# Patient Record
Sex: Female | Born: 1975 | Race: Black or African American | Hispanic: No | Smoking: Never smoker
Health system: Southern US, Community
[De-identification: ages and names within clinical notes are randomized; demographics above are authoritative.]

## PROBLEM LIST (undated history)

## (undated) DIAGNOSIS — I10 Essential (primary) hypertension: Secondary | ICD-10-CM

## (undated) DIAGNOSIS — G43909 Migraine, unspecified, not intractable, without status migrainosus: Secondary | ICD-10-CM

## (undated) DIAGNOSIS — I2699 Other pulmonary embolism without acute cor pulmonale: Secondary | ICD-10-CM

## (undated) DIAGNOSIS — D649 Anemia, unspecified: Secondary | ICD-10-CM

## (undated) DIAGNOSIS — Z8759 Personal history of other complications of pregnancy, childbirth and the puerperium: Secondary | ICD-10-CM

## (undated) DIAGNOSIS — I82409 Acute embolism and thrombosis of unspecified deep veins of unspecified lower extremity: Secondary | ICD-10-CM

## (undated) DIAGNOSIS — O2230 Deep phlebothrombosis in pregnancy, unspecified trimester: Secondary | ICD-10-CM

## (undated) DIAGNOSIS — Z86718 Personal history of other venous thrombosis and embolism: Secondary | ICD-10-CM

## (undated) DIAGNOSIS — O24419 Gestational diabetes mellitus in pregnancy, unspecified control: Secondary | ICD-10-CM

## (undated) DIAGNOSIS — O09529 Supervision of elderly multigravida, unspecified trimester: Secondary | ICD-10-CM

## (undated) HISTORY — DX: Migraine, unspecified, not intractable, without status migrainosus: G43.909

## (undated) HISTORY — DX: Personal history of other venous thrombosis and embolism: Z86.718

## (undated) HISTORY — DX: Gestational diabetes mellitus in pregnancy, unspecified control: O24.419

## (undated) HISTORY — DX: Personal history of other complications of pregnancy, childbirth and the puerperium: Z87.59

## (undated) HISTORY — DX: Essential (primary) hypertension: I10

## (undated) HISTORY — PX: KNEE SURGERY: SHX244

## (undated) HISTORY — DX: Anemia, unspecified: D64.9

---

## 2001-10-17 ENCOUNTER — Ambulatory Visit (HOSPITAL_COMMUNITY): Admission: RE | Admit: 2001-10-17 | Discharge: 2001-10-17 | Payer: Self-pay | Admitting: Pulmonary Disease

## 2001-12-02 ENCOUNTER — Ambulatory Visit (HOSPITAL_COMMUNITY): Admission: RE | Admit: 2001-12-02 | Discharge: 2001-12-02 | Payer: Self-pay | Admitting: Pulmonary Disease

## 2002-07-29 ENCOUNTER — Ambulatory Visit (HOSPITAL_COMMUNITY): Admission: RE | Admit: 2002-07-29 | Discharge: 2002-07-29 | Payer: Self-pay | Admitting: Pulmonary Disease

## 2002-08-12 ENCOUNTER — Ambulatory Visit (HOSPITAL_COMMUNITY): Admission: RE | Admit: 2002-08-12 | Discharge: 2002-08-12 | Payer: Self-pay | Admitting: Pulmonary Disease

## 2002-12-17 ENCOUNTER — Emergency Department (HOSPITAL_COMMUNITY): Admission: EM | Admit: 2002-12-17 | Discharge: 2002-12-17 | Payer: Self-pay | Admitting: Emergency Medicine

## 2004-12-29 ENCOUNTER — Other Ambulatory Visit: Admission: RE | Admit: 2004-12-29 | Discharge: 2004-12-29 | Payer: Self-pay | Admitting: Obstetrics and Gynecology

## 2005-01-31 ENCOUNTER — Ambulatory Visit (HOSPITAL_COMMUNITY): Admission: RE | Admit: 2005-01-31 | Discharge: 2005-01-31 | Payer: Self-pay | Admitting: Pulmonary Disease

## 2005-06-18 ENCOUNTER — Ambulatory Visit (HOSPITAL_COMMUNITY): Admission: RE | Admit: 2005-06-18 | Discharge: 2005-06-18 | Payer: Self-pay | Admitting: Pulmonary Disease

## 2005-10-16 ENCOUNTER — Ambulatory Visit (HOSPITAL_COMMUNITY): Admission: RE | Admit: 2005-10-16 | Discharge: 2005-10-16 | Payer: Self-pay | Admitting: Pulmonary Disease

## 2005-11-12 ENCOUNTER — Ambulatory Visit (HOSPITAL_COMMUNITY): Admission: RE | Admit: 2005-11-12 | Discharge: 2005-11-12 | Payer: Self-pay | Admitting: Pulmonary Disease

## 2005-11-22 ENCOUNTER — Ambulatory Visit (HOSPITAL_COMMUNITY): Admission: RE | Admit: 2005-11-22 | Discharge: 2005-11-22 | Payer: Self-pay | Admitting: Pulmonary Disease

## 2006-01-14 ENCOUNTER — Ambulatory Visit (HOSPITAL_COMMUNITY): Admission: RE | Admit: 2006-01-14 | Discharge: 2006-01-14 | Payer: Self-pay | Admitting: Pulmonary Disease

## 2006-01-14 ENCOUNTER — Ambulatory Visit: Payer: Self-pay | Admitting: *Deleted

## 2006-01-16 ENCOUNTER — Ambulatory Visit: Payer: Self-pay | Admitting: *Deleted

## 2006-01-18 ENCOUNTER — Ambulatory Visit (HOSPITAL_COMMUNITY): Admission: RE | Admit: 2006-01-18 | Discharge: 2006-01-18 | Payer: Self-pay | Admitting: *Deleted

## 2006-01-18 ENCOUNTER — Ambulatory Visit: Payer: Self-pay | Admitting: Cardiology

## 2006-02-01 ENCOUNTER — Ambulatory Visit: Payer: Self-pay | Admitting: *Deleted

## 2006-02-15 ENCOUNTER — Ambulatory Visit: Payer: Self-pay | Admitting: *Deleted

## 2006-03-19 ENCOUNTER — Ambulatory Visit: Payer: Self-pay | Admitting: *Deleted

## 2006-08-19 ENCOUNTER — Ambulatory Visit (HOSPITAL_COMMUNITY): Admission: RE | Admit: 2006-08-19 | Discharge: 2006-08-19 | Payer: Self-pay | Admitting: Pulmonary Disease

## 2007-02-26 ENCOUNTER — Ambulatory Visit (HOSPITAL_COMMUNITY): Admission: RE | Admit: 2007-02-26 | Discharge: 2007-02-26 | Payer: Self-pay | Admitting: Pulmonary Disease

## 2007-05-12 ENCOUNTER — Ambulatory Visit (HOSPITAL_COMMUNITY): Admission: RE | Admit: 2007-05-12 | Discharge: 2007-05-12 | Payer: Self-pay | Admitting: Pulmonary Disease

## 2007-05-26 ENCOUNTER — Ambulatory Visit (HOSPITAL_COMMUNITY): Admission: RE | Admit: 2007-05-26 | Discharge: 2007-05-26 | Payer: Self-pay | Admitting: Pulmonary Disease

## 2008-02-26 ENCOUNTER — Ambulatory Visit (HOSPITAL_COMMUNITY): Admission: RE | Admit: 2008-02-26 | Discharge: 2008-02-26 | Payer: Self-pay | Admitting: Pulmonary Disease

## 2009-01-24 ENCOUNTER — Ambulatory Visit (HOSPITAL_COMMUNITY): Admission: RE | Admit: 2009-01-24 | Discharge: 2009-01-24 | Payer: Self-pay | Admitting: Pulmonary Disease

## 2009-02-19 ENCOUNTER — Emergency Department (HOSPITAL_COMMUNITY): Admission: EM | Admit: 2009-02-19 | Discharge: 2009-02-19 | Payer: Self-pay | Admitting: Emergency Medicine

## 2009-10-18 ENCOUNTER — Ambulatory Visit (HOSPITAL_COMMUNITY): Admission: RE | Admit: 2009-10-18 | Discharge: 2009-10-18 | Payer: Self-pay | Admitting: Pulmonary Disease

## 2009-12-13 ENCOUNTER — Ambulatory Visit: Payer: Self-pay | Admitting: Gastroenterology

## 2009-12-13 DIAGNOSIS — R1013 Epigastric pain: Secondary | ICD-10-CM

## 2009-12-14 DIAGNOSIS — R143 Flatulence: Secondary | ICD-10-CM

## 2009-12-14 DIAGNOSIS — R1084 Generalized abdominal pain: Secondary | ICD-10-CM

## 2009-12-14 DIAGNOSIS — R142 Eructation: Secondary | ICD-10-CM

## 2009-12-14 DIAGNOSIS — R141 Gas pain: Secondary | ICD-10-CM

## 2009-12-16 LAB — CONVERTED CEMR LAB: Lipase: 47 units/L (ref 0–75)

## 2010-01-17 ENCOUNTER — Ambulatory Visit: Payer: Self-pay | Admitting: Gastroenterology

## 2010-03-02 IMAGING — CR DG CHEST 2V
2 series · 2 of 2 positions shown · non-contrast
Comparison: 01/24/2009

CLINICAL DATA: Left-sided chest pain.

CHEST - 2 VIEW

[view not recorded (1 of 2)]
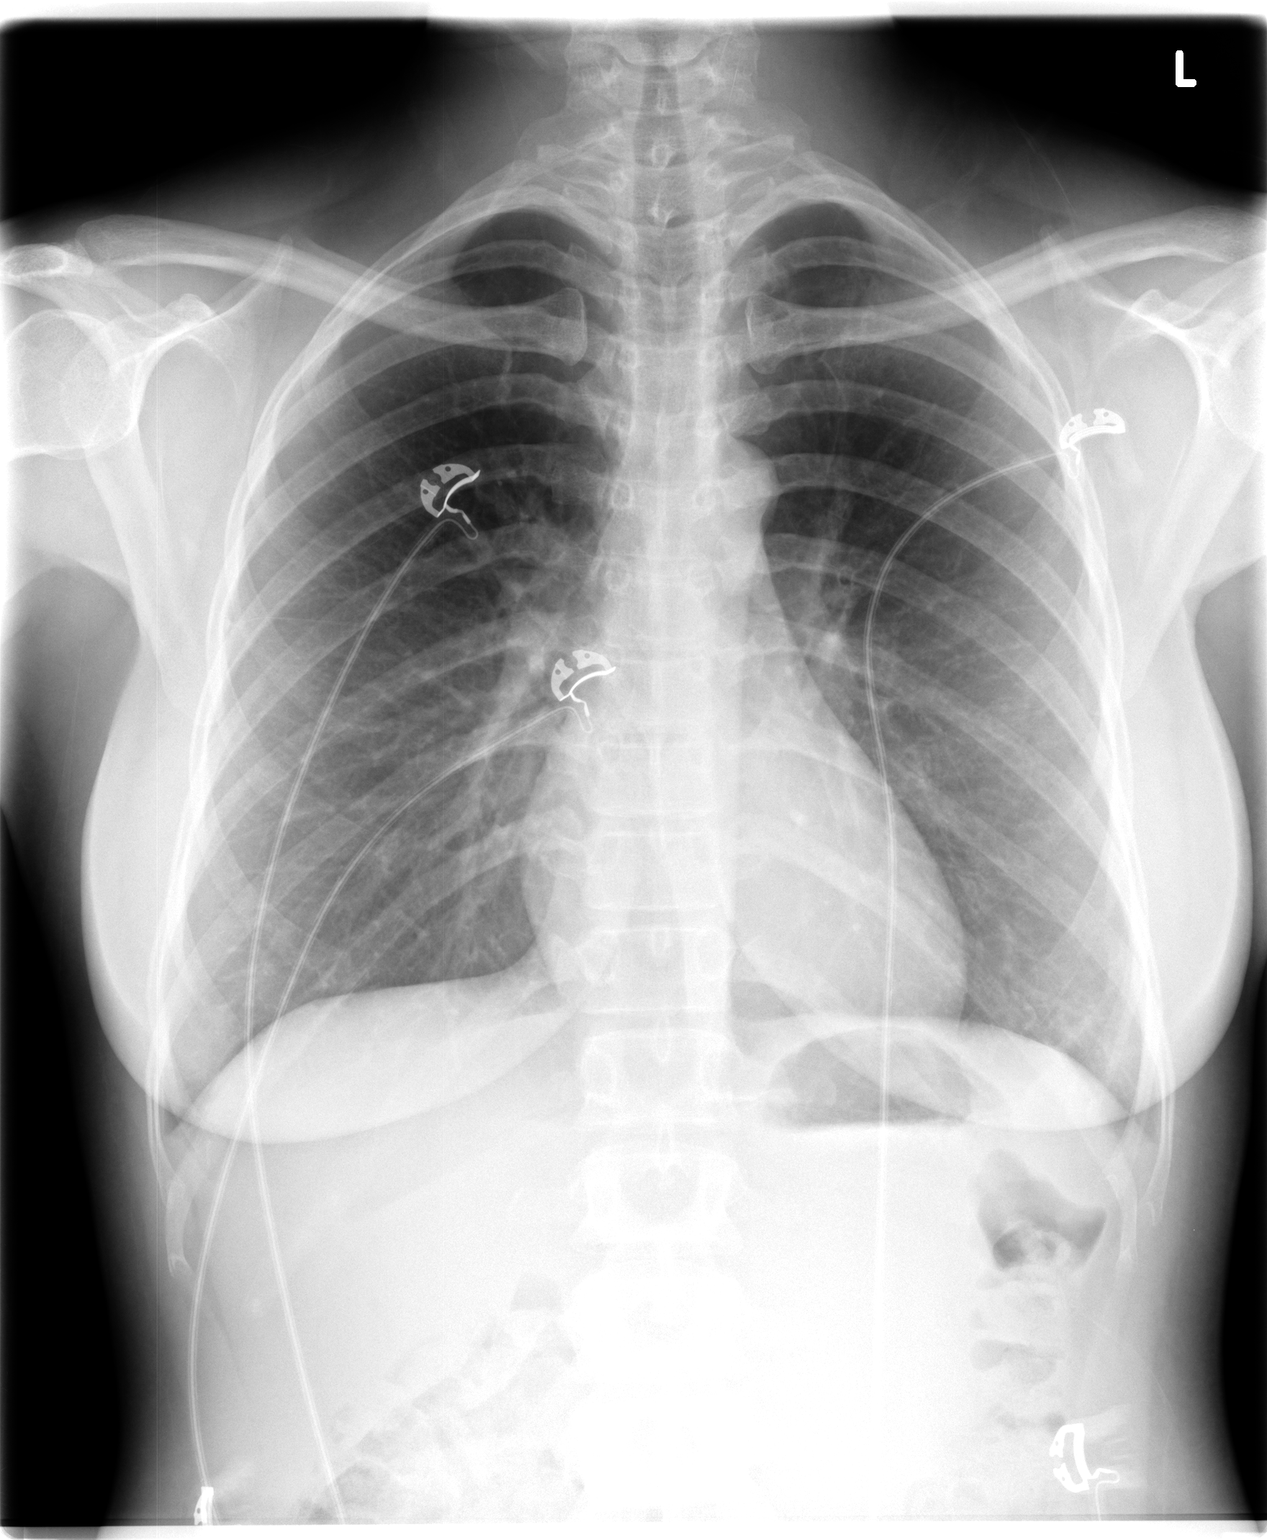

[view not recorded (2 of 2)]
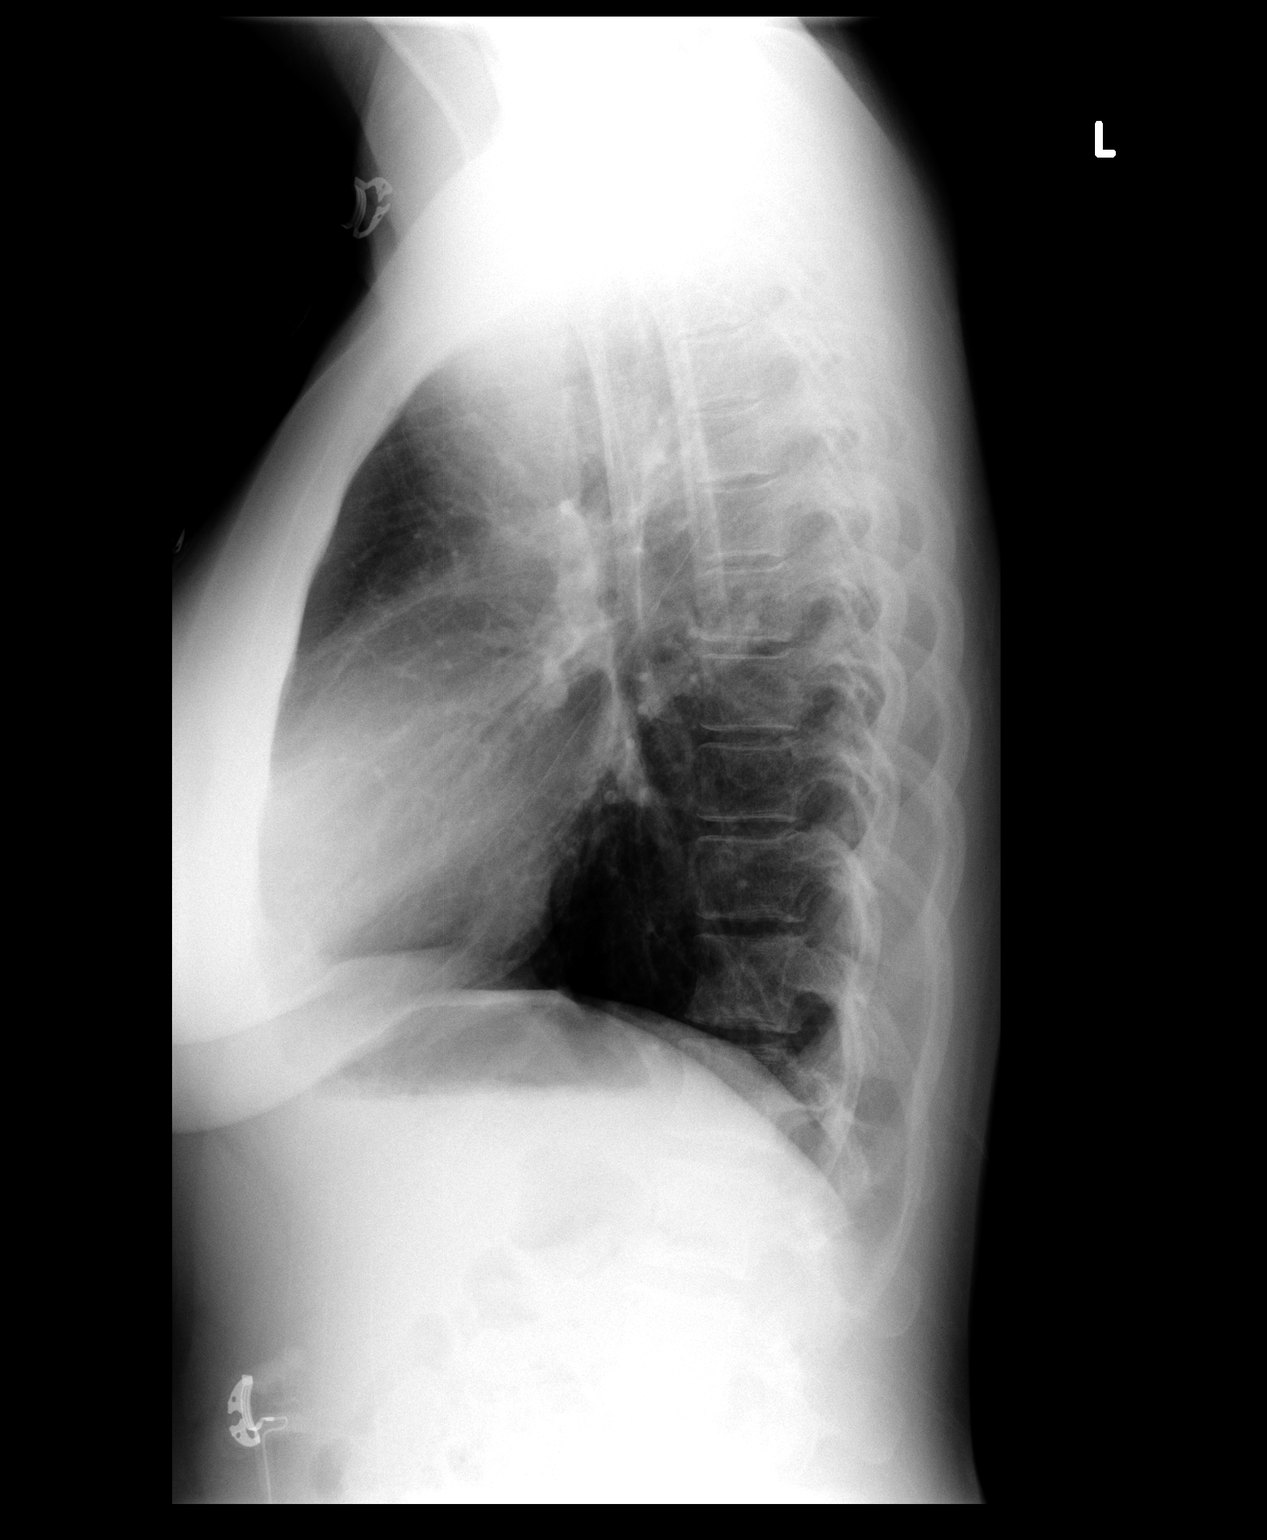

[2 of 2 positions shown; findings below may reference images not displayed]

FINDINGS: The heart size and vascularity are normal and the lungs
are clear.  No bony abnormality.
IMPRESSION: Normal chest, unchanged.

## 2010-04-03 ENCOUNTER — Ambulatory Visit (HOSPITAL_COMMUNITY): Admission: RE | Admit: 2010-04-03 | Discharge: 2010-04-03 | Payer: Self-pay | Admitting: Pulmonary Disease

## 2010-08-08 ENCOUNTER — Ambulatory Visit (HOSPITAL_COMMUNITY): Admission: RE | Admit: 2010-08-08 | Discharge: 2010-08-08 | Payer: Self-pay | Admitting: Pulmonary Disease

## 2010-08-10 ENCOUNTER — Encounter (HOSPITAL_COMMUNITY): Admission: RE | Admit: 2010-08-10 | Discharge: 2010-09-09 | Payer: Self-pay | Admitting: Pulmonary Disease

## 2010-08-21 ENCOUNTER — Ambulatory Visit (HOSPITAL_COMMUNITY): Admission: RE | Admit: 2010-08-21 | Discharge: 2010-08-21 | Payer: Self-pay | Admitting: Pulmonary Disease

## 2010-09-07 ENCOUNTER — Ambulatory Visit: Payer: Self-pay | Admitting: Gastroenterology

## 2010-09-20 ENCOUNTER — Ambulatory Visit: Payer: Self-pay | Admitting: Gastroenterology

## 2010-09-20 ENCOUNTER — Ambulatory Visit (HOSPITAL_COMMUNITY): Admission: RE | Admit: 2010-09-20 | Discharge: 2010-09-20 | Payer: Self-pay | Admitting: Gastroenterology

## 2010-12-26 NOTE — Assessment & Plan Note (Signed)
Summary: ABD PAIN   Visit Type:  Follow-up Visit Primary Care Provider:  Juanetta Gosling, M.D.  Chief Complaint:  F/U abd pain.  History of Present Illness: Sx started 2011 and Sx: worse-more frquent and more sever. Still bothered by intermittent abd pain. Had a spell SEP 10. Doubled over in pain and could hardly walk. Took Nexium and 2 Tylenol. After it went away, he did an UGI and HIDA which were negative. Rx: pill for abd pain?. pain: epigastric, no radiation, No NV or change in bowel habits. No black stool, dysphagia, pain with swallowing, or BRBPR. Weight loss: 2-3 lbs. Still having family stress. No EtOH, ASA, BCs, Goodys, Ibuprofen, Motrin or Aleve. BM: 3-4 times-mostly solid. abd pain 3-4x/wk, may last 30 mins to an hour, and happens multiple times a day. No triggers identified. Didn't feel avoiding diary or DA-LI made a difference.  Current Medications (verified): 1)  Tandem Plus 162-115.2-1 Mg Caps (Fefum-Fepo-Fa-B Cmp-C-Zn-Mn-Cu) .... Take 1 Tablet By Mouth Once A Day 2)  Nexium 40 Mg Cpdr (Esomeprazole Magnesium) .... Take 1 Tablet By Mouth Once A Day 3)  Lasix 40 Mg Tabs (Furosemide) .... Once Daily As Needed 4)  Tylenol .... As Needed 5)  Topamax 50 Mg Tabs (Topiramate) .... 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  Review of Systems       SEP 2011: HIDA w/ cck- GBEF 45%, UGI: NL  Vital Signs:  Patient profile:   35 year old female Height:      60 inches Weight:      123 pounds BMI:     24.11 Temp:     98.6 degrees F oral Pulse rate:   72 / minute BP sitting:   104 / 80  (left arm) Cuff size:   regular  Vitals Entered By: Cloria Spring LPN (September 07, 2010 9:04 AM)  Physical Exam  General:  Well developed, well nourished, no acute distress. Head:  Normocephalic and atraumatic. Eyes:  PERRL, no icterus. Mouth:  No deformity or lesions. Lungs:  Clear throughout to auscultation. Heart:  Regular rate and rhythm; no murmurs. Abdomen:  Soft,mild TTP in epigastrium and nondistended. No masses, hepatosplenomegaly or hernias noted. Normal bowel sounds. Negative Carnett's sign. Extremities:  No edema noted. Neurologic:  Alert and  oriented x4;  grossly normal neurologically.  Impression & Recommendations:  Problem # 1:  EPIGASTRIC PAIN (ICD-789.06) Assessment Unchanged most likely 2o to NON-ULCER DYSPEPSIA, LESS Likely 2o to H. pylori gastritis, eosinophilic gastitis, celiac sprue, PUD, or MALT lymphoma. EGD next week. Continue Nexium OPV in 2 mos.  CC: PCP  Orders: Est. Patient Level V (53664) Prescriptions: LIDOCAINE VISCOUS 2 % SOLN (LIDOCAINE HCL) 2 tsp mixed with 1 TBSP Maalox/Mylanta by mouth q4h as needed for abd pain  #100 cc x 5   Entered and Authorized by:   West Bali MD   Signed by:   West Bali MD on 09/07/2010   Method used:   Electronically to        [facility] [address] [phone]* (retail)       [address]       [place], [place]  [address]  Ph: [phone]       Fax: [phone]   RxID:   [id number]   Appended Document: ABD PAIN F/U OPV IS IN THE COMPUTER

## 2010-12-26 NOTE — Assessment & Plan Note (Signed)
Summary: SEVERE ABD PAIN/CM   Visit Type:  Initial Consult Referring Provider:  Juanetta Gosling Primary Care Provider:  Juanetta Gosling, M.D.  Chief Complaint:  Abd pain.  History of Present Illness: Had it in NOV and then it stopped a couple of days. Top of stomach, sharp, doesn't move. No NV, hb/indigestion, or problems swallowing. No precipitating factors. Worse: as the day goes on it gets worse, usu. by midday. This episode started 5 days-been about the same. Rx: Tylenol (eases some). No ASA, BC, Goodys, or Alve. Had one Advil for abd pain. No EtoH or tobacco. No weight loss. Diarrhea on Sun. Usu. BM q2-3x/week. Passing gas makes feel better. Bloating with most intense. Eating doesn't affect the pain. no IBS Dx ever. Ice cream Sun, yogurt this AM, no milk or cheese. Many psychosocial stressors: family, church.  Suppose to take Tandem daily. On Nexium since Nov and it helped but not helping now.  Preventive Screening-Counseling & Management      Drug Use:  no.    Current Medications (verified): 1)  Tandem Plus 162-115.2-1 Mg Caps (Fefum-Fepo-Fa-B Cmp-C-Zn-Mn-Cu) .... Take 1 Tablet By Mouth Once A Day 2)  Nexium 40 Mg Cpdr (Esomeprazole Magnesium) .... Take 1 Tablet By Mouth Once A Day 3)  Topamax 25 Mg Tabs (Topiramate) .... Three Tablets Twice Daily 4)  Lasix 40 Mg Tabs (Furosemide) .... Once Daily As Needed 5)  Tylenol .... 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 Drug Use:  no  Review of Systems       Records reviewed from 2003 to present.   ABD U/S in NOV 2010-WNLs NOV 2010:  UA (NO sX) POS NITRITES >100k E.cOLI HB 12.8 PLT 315 Cr 0.97 TBILI 0.4 ALK PHOS 38 AST 15 ALT 10 ALB  4.01 Dec 2009 Cr 1.05, AST 15, ALT 10 ALB 4.1 K 3.8 ALK PHOS 38 TBILI 0.3  Per HPI, otherwise all systems negative.  Vital Signs:  Patient profile:   35 year old female Height:      60 inches Weight:      126 pounds BMI:     24.70 Temp:     98.1 degrees F oral Pulse rate:   84 / minute BP sitting:   110 / 80  (left arm) Cuff size:   regular  Vitals Entered By: Cloria Spring LPN (December 13, 2009 10:22 AM)  Physical Exam  General:  Well developed, well nourished, no acute distress. Head:  Normocephalic and atraumatic. Eyes:  PERRLA, no icterus. Mouth:  No deformity or lesions. Neck:  Supple; no masses. Lungs:  Clear throughout to auscultation. Heart:  Regular rate and rhythm; no murmurs, rubs,  or bruits. Abdomen:  Soft, nondistended. No masses,  noted. Normal bowel sounds. Mild TTP in LUQ/RLQ w/o rebound or guarding. Nontender epigastrium. Msk:  Symmetrical with no gross deformities. Normal posture. Extremities:  No cyanosis, or edema noted. Neurologic:  Alert and  oriented x4;  grossly normal neurologically.  Impression & Recommendations:  Problem # 1:  EPIGASTRIC PAIN (ICD-789.06) Assessment New Intermittent likely 2o to non-ulcer dyspepsia. Differential includes IBS-mixed pattern, lactose intolerance, atypical reflux, H. pylori gastritis, less likely celiac sprue, autoimmune pancreatitis, SBBO, eosinophilic gastritis, MALT lymphoma, or malignancy. Pt hesitant  to have EGD. Will await H. pylori serology to decide on timing of EGD. Add probiotics and increase Nexium. OPV in 1 month. Samples #32 given DA-LI.  CC: Dr. Juanetta Gosling  Time to perform H&P, PE, review records, and discuss differential and treatment options: 45 mins  Other Orders: Consultation Level V (16109)  Patient Instructions: 1)  Take Digestive Advantage Lactose Intolerance daily. 2)  Increase Nexium to two times a day at 7 AM and 5 PM. 3)  Call me with results of H. pylori serology. If negative you will  need an EGD and if positive we will treat with Abx. 4)  Return visit in 1 month. 5)  Check Lipase today if blood still available. 6)  The medication list was reviewed and reconciled.  All changed / newly prescribed medications were explained.  A complete medication list was provided to the patient / caregiver.

## 2010-12-26 NOTE — Assessment & Plan Note (Signed)
Summary: EFU OV 29MO,ABD PAIN.BLOATING./GU   Visit Type:  Follow-up Visit Primary Care Provider:  Dr Rulon Sera  Chief Complaint:  follow up- doing ok.  History of Present Illness: Seen 12/13/2009 by Dr Darrick Penna for epigastric pain.  Started on PPI BID & DIgestive advantage.  Lipase normal.  A little better. Epigastric pain once per week, was constant.  Denies nausea or vomiting.  Taking digestive advantage as needed usu once weekly.  Nexium two times a day.  Iron daily.  H pylori negative.  On coumadin previously yrs ago became anemic been on iron since that time.  LMP 2wks ago, normal for her.  Hx heavy menses.  Appetite comes & goes.  BM 2-3 per wk without rectal bleeding or melena.  Satisfied w/ bowel movts.   Current Medications (verified): 1)  Tandem Plus 162-115.2-1 Mg Caps (Fefum-Fepo-Fa-B Cmp-C-Zn-Mn-Cu) .... Take 1 Tablet By Mouth Once A Day 2)  Nexium 40 Mg Cpdr (Esomeprazole Magnesium) .... Take 1 Tablet By Mouth Once A Day 3)  Topamax 25 Mg Tabs (Topiramate) .... Three Tablets Twice Daily 4)  Lasix 40 Mg Tabs (Furosemide) .... Once Daily As Needed 5)  Tylenol .... As Needed  Allergies (verified): No Known Drug Allergies  Review of Systems      See HPI General:  Denies fever, chills, sweats, anorexia, fatigue, weakness, malaise, weight loss, and sleep disorder. CV:  Denies chest pains, angina, palpitations, syncope, dyspnea on exertion, orthopnea, PND, peripheral edema, and claudication. Resp:  Denies dyspnea at rest, dyspnea with exercise, cough, sputum, wheezing, coughing up blood, and pleurisy. GI:  Denies difficulty swallowing, pain on swallowing, indigestion/heartburn, vomiting, vomiting blood, jaundice, diarrhea, bloody BM's, black BMs, and fecal incontinence. Derm:  Denies rash, itching, dry skin, hives, moles, warts, and unhealing ulcers. Psych:  Denies depression, anxiety, memory loss, suicidal ideation, hallucinations, paranoia, phobia, and confusion. Heme:  Denies  bruising, bleeding, and enlarged lymph nodes.  Vital Signs:  Patient profile:   35 year old female Height:      60 inches Weight:      127 pounds BMI:     24.89 Temp:     97.8 degrees F oral Pulse rate:   76 / minute BP sitting:   108 / 78  (left arm) Cuff size:   regular  Vitals Entered By: Hendricks Limes LPN (January 17, 2010 11:18 AM)  Physical Exam  General:  Well developed, well nourished, no acute distress. Head:  Normocephalic and atraumatic. Eyes:  Sclera clear, no icterus. Ears:  Normal auditory acuity. Nose:  No deformity, discharge,  or lesions. Mouth:  No deformity or lesions, dentition normal. Neck:  Supple; no masses or thyromegaly. Heart:  Regular rate and rhythm; no murmurs, rubs,  or bruits. Abdomen:  Soft, nontender and nondistended. No masses, hepatosplenomegaly or hernias noted. Normal bowel sounds.without guarding and without rebound.   Msk:  Symmetrical with no gross deformities. Normal posture. Pulses:  Normal pulses noted. Extremities:  No clubbing, cyanosis, edema or deformities noted. Neurologic:  Alert and  oriented x4;  grossly normal neurologically. Skin:  Intact without significant lesions or rashes. Cervical Nodes:  No significant cervical adenopathy. Axillary Nodes:  No significant axillary adenopathy. Inguinal Nodes:  No significant inguinal adenopathy. Psych:  Alert and cooperative. Normal mood and affect.  Impression & Recommendations:  Problem # 1:  EPIGASTRIC PAIN (ICD-789.06) 35 y/o black female w/ epigastric pain with good response to two times a day PPI, but not complete.  I encouraged EGD for further  evaluation to r/o PUD, gastritis; however, pt does not want to proceed at this time.  I have discussed risks and benefits which include, but are not limited to, bleeding, infection, perforation, or medication reaction.  She will call within next couple weeks of she changes her mind.   Orders: Est. Patient Level III (86578)  Patient  Instructions: 1)  Decrease nexium to 40mg  daily 2)  If you change your mind and want EGD, please let us know 3)  6 months w/ Dr Darrick Penna

## 2011-03-08 LAB — DIFFERENTIAL
Basophils Absolute: 0 10*3/uL (ref 0.0–0.1)
Eosinophils Relative: 3 % (ref 0–5)
Lymphocytes Relative: 34 % (ref 12–46)
Lymphs Abs: 1.9 10*3/uL (ref 0.7–4.0)
Monocytes Absolute: 0.4 10*3/uL (ref 0.1–1.0)
Monocytes Relative: 8 % (ref 3–12)
Neutro Abs: 3 10*3/uL (ref 1.7–7.7)

## 2011-03-08 LAB — CBC
HCT: 40.4 % (ref 36.0–46.0)
Hemoglobin: 13.4 g/dL (ref 12.0–15.0)
RBC: 4.74 MIL/uL (ref 3.87–5.11)
WBC: 5.5 10*3/uL (ref 4.0–10.5)

## 2011-03-08 LAB — APTT: aPTT: 34 seconds (ref 24–37)

## 2011-03-08 LAB — PROTIME-INR: INR: 1 (ref 0.00–1.49)

## 2011-04-13 NOTE — Procedures (Signed)
NAME:  Sydney Holloway, SIELER             ACCOUNT NO.:  [id number]   MEDICAL RECORD NO.:  [id number]          PATIENT TYPE:  OUT   LOCATION:  RAD                           FACILITY:  [facility]   PHYSICIAN:  Cacao Bing, M.D. [facility] OF BIRTH:  14-Jul-1976   DATE OF PROCEDURE:  01/18/2006  DATE OF DISCHARGE:                                  ECHOCARDIOGRAM   REFERRING PHYSICIAN:  Ramon Dredge L. Juanetta Gosling, M.D.   CLINICAL DATA:  A 35 year old woman with exertional chest discomfort and a  history of pulmonary embolism.   RESULTS:  1.  Treadmill exercise performed to a workload of 13 METS and heart rate of      181, 96% of age-predicted maximum.  Exercise discontinued due to      fatigue; transient chest tightness also reported.  2.  Blood pressure increased from a resting value of 115/75 to 170/70.      During exercise, a normal response.  No arrhythmias noted.  3.  Baseline EKG:  Normal sinus rhythm; within normal limits.  4.  Stress EKG with 1-2 mm of upsloping ST segment depression, primarily in      the inferior leads.  Rapid reversion towards normal in recovery.   BASELINE ECHOCARDIOGRAM:  1.  Normal chamber dimensions; normal right ventricular systolic function;      normal aortic and tricuspid valve; slight mitral valve thickening with      normal function; normal regional and global left ventricular systolic      function.  2.  Post stress echocardiogram:  Hyperdynamic function in all myocardial      segments.   IMPRESSION:  Very good exercise tolerance with neither electrocardiographic  nor echocardiographic evidence for myocardial ischemia or infarction. Other  findings as noted.      Lerna Bing, M.D. [facility]  Electronically Signed     RR/MEDQ  D:  01/18/2006  T:  01/19/2006  Job:  [id number]

## 2011-04-13 NOTE — Procedures (Signed)
NAME:  Sydney Holloway, Sydney Holloway NO.:  [id number]   MEDICAL RECORD NO.:  [id number]          PATIENT TYPE:  OUT   LOCATION:  RAD                           FACILITY:  [facility]   PHYSICIAN:  Vida Roller, M.D.   DATE OF BIRTH:  October 26, 1976   DATE OF PROCEDURE:  01/14/2006  DATE OF DISCHARGE:                                  ECHOCARDIOGRAM   INDICATIONS:  This is a 35 year old female with a history of pulmonary  embolism and shortness of breath.   TAPE NUMBER:  [id number].  Tape count [id number].   REFERRING PHYSICIAN:  Dr. Kari Baars.   The technical quality of the procedure is adequate.   M-MODE TRACINGS:  The aorta is 28 mm.   Left atrium is 31 mm.   Septum is 10 mm.   Posterior wall is 10 mm.   Left ventricular diastolic dimension is 41 mm.   Left ventricular systolic dimension is 30 mm.   Two-dimensional echocardiogram and Doppler imaging:  The left ventricle is  normal size.  There is normal systolic function with estimated ejection  fraction 55-60%.  There are no wall motion abnormalities seen.   The right ventricle is normal size with normal systolic function.   Both atria are normal size.   The aortic valve is delicate.  There is no stenosis or regurgitation.   The mitral valve is delicate.  There is no significant stenosis.  There is  trivial regurgitation.   Pulmonic valve has trivial regurgitation.   The tricuspid valve has trivial regurgitation.   There is no pericardial effusion.   The inferior vena cava appears to be normal size.   The ascending aorta is normal to the limits of the study.   ASSESSMENT:  Normal echocardiogram.      Vida Roller, M.D.  Electronically Signed     JH/MEDQ  D:  01/14/2006  T:  01/15/2006  Job:  [id number]

## 2011-06-25 ENCOUNTER — Encounter (HOSPITAL_COMMUNITY): Payer: Self-pay

## 2011-06-25 ENCOUNTER — Ambulatory Visit (HOSPITAL_COMMUNITY)
Admission: RE | Admit: 2011-06-25 | Discharge: 2011-06-25 | Disposition: A | Discharge status: Home / Self Care | Payer: 59 | Source: Ambulatory Visit | Attending: Pulmonary Disease | Admitting: Pulmonary Disease

## 2011-06-25 ENCOUNTER — Other Ambulatory Visit (HOSPITAL_COMMUNITY): Payer: Self-pay | Admitting: Pulmonary Disease

## 2011-06-25 DIAGNOSIS — R7989 Other specified abnormal findings of blood chemistry: Secondary | ICD-10-CM | POA: Insufficient documentation

## 2011-06-25 DIAGNOSIS — R0602 Shortness of breath: Secondary | ICD-10-CM | POA: Insufficient documentation

## 2011-06-25 MED ORDER — IOHEXOL 350 MG/ML SOLN
100.0000 mL | Freq: Once | INTRAVENOUS | Status: AC | PRN
  Administered 2011-06-25: 100 mL via INTRAVENOUS

## 2011-11-09 ENCOUNTER — Ambulatory Visit (HOSPITAL_COMMUNITY)
Admission: RE | Admit: 2011-11-09 | Discharge: 2011-11-09 | Disposition: A | Discharge status: Home / Self Care | Payer: 59 | Source: Ambulatory Visit | Attending: Pulmonary Disease | Admitting: Pulmonary Disease

## 2011-11-09 ENCOUNTER — Other Ambulatory Visit (HOSPITAL_COMMUNITY): Payer: Self-pay | Admitting: Pulmonary Disease

## 2011-11-09 DIAGNOSIS — M542 Cervicalgia: Secondary | ICD-10-CM | POA: Insufficient documentation

## 2011-11-09 DIAGNOSIS — M25519 Pain in unspecified shoulder: Secondary | ICD-10-CM

## 2011-11-09 DIAGNOSIS — R209 Unspecified disturbances of skin sensation: Secondary | ICD-10-CM | POA: Insufficient documentation

## 2012-01-02 ENCOUNTER — Other Ambulatory Visit (HOSPITAL_COMMUNITY): Payer: Self-pay | Admitting: Pulmonary Disease

## 2012-01-02 DIAGNOSIS — R52 Pain, unspecified: Secondary | ICD-10-CM

## 2012-01-03 ENCOUNTER — Ambulatory Visit (HOSPITAL_COMMUNITY)
Admission: RE | Admit: 2012-01-03 | Discharge: 2012-01-03 | Disposition: A | Discharge status: Home / Self Care | Payer: 59 | Source: Ambulatory Visit | Attending: Pulmonary Disease | Admitting: Pulmonary Disease

## 2012-01-03 DIAGNOSIS — M67919 Unspecified disorder of synovium and tendon, unspecified shoulder: Secondary | ICD-10-CM | POA: Insufficient documentation

## 2012-01-03 DIAGNOSIS — R52 Pain, unspecified: Secondary | ICD-10-CM

## 2012-01-03 DIAGNOSIS — M719 Bursopathy, unspecified: Secondary | ICD-10-CM | POA: Insufficient documentation

## 2012-01-03 DIAGNOSIS — M25519 Pain in unspecified shoulder: Secondary | ICD-10-CM | POA: Insufficient documentation

## 2012-06-04 ENCOUNTER — Other Ambulatory Visit (HOSPITAL_COMMUNITY): Payer: Self-pay | Admitting: Pulmonary Disease

## 2012-06-04 ENCOUNTER — Ambulatory Visit (HOSPITAL_COMMUNITY)
Admission: RE | Admit: 2012-06-04 | Discharge: 2012-06-04 | Disposition: A | Discharge status: Home / Self Care | Payer: 59 | Source: Ambulatory Visit | Attending: Pulmonary Disease | Admitting: Pulmonary Disease

## 2012-06-04 DIAGNOSIS — M25579 Pain in unspecified ankle and joints of unspecified foot: Secondary | ICD-10-CM | POA: Insufficient documentation

## 2012-06-04 DIAGNOSIS — M79672 Pain in left foot: Secondary | ICD-10-CM

## 2012-06-10 ENCOUNTER — Ambulatory Visit (HOSPITAL_COMMUNITY)
Admission: RE | Admit: 2012-06-10 | Discharge: 2012-06-10 | Disposition: A | Discharge status: Home / Self Care | Payer: 59 | Source: Ambulatory Visit | Attending: Pulmonary Disease | Admitting: Pulmonary Disease

## 2012-06-10 ENCOUNTER — Other Ambulatory Visit (HOSPITAL_COMMUNITY): Payer: Self-pay | Admitting: Pulmonary Disease

## 2012-06-10 DIAGNOSIS — M79609 Pain in unspecified limb: Secondary | ICD-10-CM | POA: Insufficient documentation

## 2012-06-10 DIAGNOSIS — R52 Pain, unspecified: Secondary | ICD-10-CM | POA: Insufficient documentation

## 2012-06-10 DIAGNOSIS — R609 Edema, unspecified: Secondary | ICD-10-CM

## 2012-06-13 ENCOUNTER — Other Ambulatory Visit: Payer: Self-pay | Admitting: Obstetrics and Gynecology

## 2012-07-14 ENCOUNTER — Other Ambulatory Visit (HOSPITAL_COMMUNITY): Payer: Self-pay | Admitting: Pulmonary Disease

## 2012-07-14 ENCOUNTER — Ambulatory Visit (HOSPITAL_COMMUNITY)
Admission: RE | Admit: 2012-07-14 | Discharge: 2012-07-14 | Disposition: A | Discharge status: Home / Self Care | Payer: 59 | Source: Ambulatory Visit | Attending: Pulmonary Disease | Admitting: Pulmonary Disease

## 2012-07-14 DIAGNOSIS — S6990XA Unspecified injury of unspecified wrist, hand and finger(s), initial encounter: Secondary | ICD-10-CM | POA: Insufficient documentation

## 2012-07-14 DIAGNOSIS — W19XXXA Unspecified fall, initial encounter: Secondary | ICD-10-CM | POA: Insufficient documentation

## 2013-07-23 ENCOUNTER — Other Ambulatory Visit (HOSPITAL_COMMUNITY): Payer: Self-pay | Admitting: Pulmonary Disease

## 2013-07-23 ENCOUNTER — Ambulatory Visit (HOSPITAL_COMMUNITY)
Admission: RE | Admit: 2013-07-23 | Discharge: 2013-07-23 | Disposition: A | Discharge status: Home / Self Care | Payer: 59 | Source: Ambulatory Visit | Attending: Pulmonary Disease | Admitting: Pulmonary Disease

## 2013-07-23 DIAGNOSIS — R109 Unspecified abdominal pain: Secondary | ICD-10-CM

## 2013-10-05 ENCOUNTER — Other Ambulatory Visit (HOSPITAL_COMMUNITY): Payer: Self-pay | Admitting: Pulmonary Disease

## 2013-10-05 ENCOUNTER — Ambulatory Visit (HOSPITAL_COMMUNITY)
Admission: RE | Admit: 2013-10-05 | Discharge: 2013-10-05 | Disposition: A | Discharge status: Home / Self Care | Payer: 59 | Source: Ambulatory Visit | Attending: Pulmonary Disease | Admitting: Pulmonary Disease

## 2013-10-05 DIAGNOSIS — M25569 Pain in unspecified knee: Secondary | ICD-10-CM | POA: Insufficient documentation

## 2013-10-05 DIAGNOSIS — R52 Pain, unspecified: Secondary | ICD-10-CM

## 2013-10-27 ENCOUNTER — Other Ambulatory Visit (HOSPITAL_COMMUNITY): Payer: Self-pay | Admitting: Pulmonary Disease

## 2013-10-27 DIAGNOSIS — G8929 Other chronic pain: Secondary | ICD-10-CM

## 2013-10-28 ENCOUNTER — Ambulatory Visit (HOSPITAL_COMMUNITY)
Admission: RE | Admit: 2013-10-28 | Discharge: 2013-10-28 | Disposition: A | Discharge status: Home / Self Care | Payer: 59 | Source: Ambulatory Visit | Attending: Pulmonary Disease | Admitting: Pulmonary Disease

## 2013-10-28 DIAGNOSIS — M25469 Effusion, unspecified knee: Secondary | ICD-10-CM | POA: Insufficient documentation

## 2013-10-28 DIAGNOSIS — M25569 Pain in unspecified knee: Secondary | ICD-10-CM | POA: Insufficient documentation

## 2013-10-28 DIAGNOSIS — M23305 Other meniscus derangements, unspecified medial meniscus, unspecified knee: Secondary | ICD-10-CM | POA: Insufficient documentation

## 2013-10-28 DIAGNOSIS — G8929 Other chronic pain: Secondary | ICD-10-CM

## 2013-11-02 ENCOUNTER — Other Ambulatory Visit (HOSPITAL_COMMUNITY): Payer: Self-pay | Admitting: Pulmonary Disease

## 2013-11-02 ENCOUNTER — Ambulatory Visit (HOSPITAL_COMMUNITY)
Admission: RE | Admit: 2013-11-02 | Discharge: 2013-11-02 | Disposition: A | Discharge status: Home / Self Care | Payer: 59 | Source: Ambulatory Visit | Attending: Pulmonary Disease | Admitting: Pulmonary Disease

## 2013-11-02 DIAGNOSIS — R091 Pleurisy: Secondary | ICD-10-CM | POA: Insufficient documentation

## 2013-11-02 DIAGNOSIS — M549 Dorsalgia, unspecified: Secondary | ICD-10-CM | POA: Insufficient documentation

## 2013-12-24 ENCOUNTER — Ambulatory Visit (HOSPITAL_COMMUNITY)
Admission: RE | Admit: 2013-12-24 | Discharge: 2013-12-24 | Disposition: A | Discharge status: Home / Self Care | Payer: 59 | Source: Ambulatory Visit | Attending: Orthopedic Surgery | Admitting: Orthopedic Surgery

## 2013-12-24 DIAGNOSIS — M6281 Muscle weakness (generalized): Secondary | ICD-10-CM | POA: Insufficient documentation

## 2013-12-24 DIAGNOSIS — R29898 Other symptoms and signs involving the musculoskeletal system: Secondary | ICD-10-CM

## 2013-12-24 DIAGNOSIS — M25659 Stiffness of unspecified hip, not elsewhere classified: Secondary | ICD-10-CM | POA: Insufficient documentation

## 2013-12-24 DIAGNOSIS — M25569 Pain in unspecified knee: Secondary | ICD-10-CM | POA: Insufficient documentation

## 2013-12-24 DIAGNOSIS — M25669 Stiffness of unspecified knee, not elsewhere classified: Secondary | ICD-10-CM

## 2013-12-24 DIAGNOSIS — R262 Difficulty in walking, not elsewhere classified: Secondary | ICD-10-CM

## 2013-12-24 DIAGNOSIS — IMO0001 Reserved for inherently not codable concepts without codable children: Secondary | ICD-10-CM | POA: Insufficient documentation

## 2013-12-24 NOTE — Evaluation (Signed)
Physical Therapy Evaluation  Patient Details  Name: Sydney Holloway MRN: [id number] Date of Birth: 26-May-1976  Today's Date: 12/24/2013 Time: [id number] PT Time Calculation (min): 45 min Charge evaluation 1350-1420; there ex 1421-1435             Visit#: 1 of 12  Re-eval: 01/23/14 Assessment Diagnosis: s/p Rt arthroscopic surgery Surgical Date: 12/03/13 Next MD Visit: 01/13/2014 Prior Therapy: none  Authorization: [employer]      Past Medical History: No past medical history on file. Past Surgical History: No past surgical history on file.  Subjective Symptoms/Limitations Symptoms: Pt states that her Rt knee just started bothering her she is very active she had arthroscopic surgery on 12/03/2013 there was no meniscal tear.  Pt comes to the department walking with one crutch.  Pt states that she is still having quite a bit of swelling and that her knee "catches" when she is walking down an incline, getting into a car or going up steps.  The pt states that since the surgery she is about 30% better.  Pt states she has not been icing How long can you sit comfortably?: Pt has not sat with her knee bent she keeps it straight. How long can you stand comfortably?: Pt tends to shift her weight; she can stand 10 minutes. How long can you walk comfortably?: Pt is walking with one crutch able to walk for 5 minutes.  Special Tests: waking up three times a night Pain Assessment Currently in Pain?: Yes Pain Score: 4  (worst pain has been a 6/10 best has been a 3/10) Pain Location: Knee Pain Orientation: Right Pain Type: Chronic pain Pain Onset: 1 to 4 weeks ago Pain Frequency: Constant Pain Relieving Factors: medication Effect of Pain on Daily Activities: increases    Balance Screening Balance Screen Has the patient fallen in the past 6 months: No  Prior Functioning  Prior Function Vocation: Full time employment Vocation Requirements: walking, sitting,  Leisure: Hobbies-yes  (Comment) Comments: walk, work out, Retail bankerchoir director,      Sensation/Coordination/Flexibility/Functional Tests Functional Tests Functional Tests: foto 34  Assessment RLE AROM (degrees) Right Knee Extension: 15 Right Knee Flexion: 86 RLE Strength Right Hip Flexion: 3/5 Right Hip Extension: 4/5 Right Hip ABduction: 3+/5 Right Hip ADduction: 5/5 Right Knee Flexion: 4/5 Right Knee Extension: 3/5 Right Ankle Dorsiflexion: 3+/5  Exercise/Treatments    Stretches Active Hamstring Stretch: 3 reps;30 seconds   Seated Long Arc Quad: 10 reps Other Seated Knee Exercises: heelraise/toe raise x 10 Supine heelslides x 10 Quad Sets: 10 reps Heel Slides: 5 reps Straight Leg Raises: 5 reps Sidelying Hip ABduction: 5 reps Prone  Hamstring Curl: 5 reps Hip Extension: 5 reps    Physical Therapy Assessment and Plan PT Assessment and Plan Clinical Impression Statement: Pt is a 38 yo female who underwent a Rt arthroscopic surgery with debridement.  There were no tears.  Pt now has decreased ROM, decreased strength, and difficulty walking.  Pt will benefit from PT to address these issues and retrun pt to prior functioning status.  Pt will benefit from skilled therapeutic intervention in order to improve on the following deficits: Decreased activity tolerance;Decreased balance;Pain;Difficulty walking;Decreased strength;Decreased range of motion Rehab Potential: Good PT Frequency: Min 3X/week PT Duration: 4 weeks PT Treatment/Interventions: Therapeutic activities;Therapeutic exercise;Manual techniques;Patient/family education;Modalities;Gait training;Stair training PT Plan: Pt to be seen for weightbearing activities to strengthen pt functionally as well as ROM; wean from crutch ASAP    Goals Home Exercise Program Pt/caregiver will Perform Home  Exercise Program: For increased ROM;For increased strengthening PT Goal: Perform Home Exercise Program - Progress: Goal set today PT Short Term  Goals Time to Complete Short Term Goals: 2 weeks PT Short Term Goal 1: Pt to be able to sit for 30 minutes with knees bent with comfort to be able to go out to eat PT Short Term Goal 2: Pt to be albe to ambulate inside without a crutch PT Short Term Goal 3: Pt to be able to stand for 20 minutes to perform part of choir  activity. PT Short Term Goal 4: Pt to be able to walk for 20 minutes for short shopping trips  PT Short Term Goal 5: Pt waking one time a night. PT Long Term Goals Time to Complete Long Term Goals: 4 weeks PT Long Term Goal 1: I in advance Home exercise program PT Long Term Goal 2: Pt to be able to sit for 60 minutes at a time to perform work duties Long Term Goal 3: Pt to be able to walk in and outside without crutch Long Term Goal 4: Pt to be able to stand for an hour at a time to perform choir duties PT Long Term Goal 5: Pt to be able to walk for 60 mintues for good health habits Additional PT Long Term Goals?: Yes PT Long Term Goal 6: Pt sleep all night without wakingl  Problem List Patient Active Problem List   Diagnosis Date Noted  . Stiffness of joint, not elsewhere classified, lower leg 12/24/2013  . Difficulty in walking(719.7) 12/24/2013  . Decreased strength involving knee joint 12/24/2013  . Pain in joint, lower leg 12/24/2013  . ABDOMINAL BLOATING 12/14/2009  . ABDOMINAL PAIN, GENERALIZED 12/14/2009  . EPIGASTRIC PAIN 12/13/2009    PT Plan of Care PT Home Exercise Plan: given   GP    RUSSELL,CINDY 12/24/2013, 4:38 PM  Physician Documentation Your signature is required to indicate approval of the treatment plan as stated above.  Please sign and either send electronically or make a copy of this report for your files and return this physician signed original.   Please mark one 1.__approve of plan  2. ___approve of plan with the following conditions.   ______________________________                                                           _____________________ Physician Signature                                                                                                             Date

## 2013-12-30 ENCOUNTER — Ambulatory Visit (HOSPITAL_COMMUNITY)
Admission: RE | Admit: 2013-12-30 | Discharge: 2013-12-30 | Disposition: A | Discharge status: Home / Self Care | Payer: 59 | Source: Ambulatory Visit | Attending: Pulmonary Disease | Admitting: Pulmonary Disease

## 2013-12-30 DIAGNOSIS — M25569 Pain in unspecified knee: Secondary | ICD-10-CM

## 2013-12-30 DIAGNOSIS — IMO0001 Reserved for inherently not codable concepts without codable children: Secondary | ICD-10-CM | POA: Insufficient documentation

## 2013-12-30 DIAGNOSIS — M25659 Stiffness of unspecified hip, not elsewhere classified: Secondary | ICD-10-CM | POA: Insufficient documentation

## 2013-12-30 DIAGNOSIS — R262 Difficulty in walking, not elsewhere classified: Secondary | ICD-10-CM

## 2013-12-30 DIAGNOSIS — M6281 Muscle weakness (generalized): Secondary | ICD-10-CM | POA: Insufficient documentation

## 2013-12-30 DIAGNOSIS — M25669 Stiffness of unspecified knee, not elsewhere classified: Secondary | ICD-10-CM

## 2013-12-30 DIAGNOSIS — R29898 Other symptoms and signs involving the musculoskeletal system: Secondary | ICD-10-CM

## 2013-12-30 NOTE — Progress Notes (Signed)
Physical Therapy Treatment Patient Details  Name: Sydney Holloway MRN: [id number] Date of Birth: May 16, 1976  Today's Date: 12/30/2013 Time: 1518-1600 PT Time Calculation (min): 42 min Charge:  There ex [id number]; manual 1549-1600 Visit#: 2 of 12  Re-eval: 01/23/14    Authorization: [employer]   Subjective: Symptoms/Limitations Symptoms: Pt states she quit using the crutch simply because it was bothering her.   Pain Assessment Pain Score: 4  Pain Location: Knee Pain Orientation: Right Pain Type: Chronic pain    Exercise/Treatments Gastroc Stretch: Limitations Gastroc Stretch Limitations: slant board x 30" Aerobic Stationary Bike: 7' for improved ROM unable to make rotation. Standing Heel Raises: 10 reps Knee Flexion: 10 reps Terminal Knee Extension: 10 reps;Theraband Theraband Level (Terminal Knee Extension): Level 4 (Blue) Lateral Step Up: Right;10 reps Forward Step Up: 10 reps;Step Height: 4" Rocker Board: 2 minutes   Supine Heel Slides: 10 reps Terminal Knee Extension: 10 reps Knee Extension: PROM Knee Flexion: PROM   Manual Therapy Manual Therapy: Other (comment) Other Manual Therapy: Manual techniques used to work on decreasing swelling   Physical Therapy Assessment and Plan PT Assessment and Plan Clinical Impression Statement: Pt to department with abnormal gt as pt was keeping her knee in a flexed postion.  Pt gt trained and able to obtain a normal heel toe gt with equal stride.  Instructed pt in new weightbearing exercises with therapist facilitation for proper technique.  Pt has noted suprapatella swelling manual technique used to decrease swelling. PT Plan: Continue with strengthening and swelling reducing     Goals  progressing  Problem List Patient Active Problem List   Diagnosis Date Noted  . Stiffness of joint, not elsewhere classified, lower leg 12/24/2013  . Difficulty in walking(719.7) 12/24/2013  . Decreased strength involving knee joint  12/24/2013  . Pain in joint, lower leg 12/24/2013  . ABDOMINAL BLOATING 12/14/2009  . ABDOMINAL PAIN, GENERALIZED 12/14/2009  . EPIGASTRIC PAIN 12/13/2009       GP    Jose Corvin,CINDY 12/30/2013, 4:15 PM

## 2014-01-01 ENCOUNTER — Ambulatory Visit (HOSPITAL_COMMUNITY)
Admission: RE | Admit: 2014-01-01 | Discharge: 2014-01-01 | Disposition: A | Discharge status: Home / Self Care | Payer: 59 | Source: Ambulatory Visit | Attending: Pulmonary Disease | Admitting: Pulmonary Disease

## 2014-01-01 DIAGNOSIS — R262 Difficulty in walking, not elsewhere classified: Secondary | ICD-10-CM

## 2014-01-01 DIAGNOSIS — M25569 Pain in unspecified knee: Secondary | ICD-10-CM

## 2014-01-01 DIAGNOSIS — M25669 Stiffness of unspecified knee, not elsewhere classified: Secondary | ICD-10-CM

## 2014-01-01 DIAGNOSIS — R29898 Other symptoms and signs involving the musculoskeletal system: Secondary | ICD-10-CM

## 2014-01-01 NOTE — Progress Notes (Signed)
Physical Therapy Treatment Patient Details  Name: Sydney Holloway MRN: [id number] Date of Birth: 05/13/1976  Today's Date: 01/01/2014 Time: 8295-62131515-1555 PT Time Calculation (min): 40 min Charge;  There ex [id number]; manual [id number] Visit#: 4 of 12  Re-eval: 01/23/14   Authorization: [employer]  Subjective: Symptoms/Limitations Symptoms: Pt to department with normalized gait although slower cadance.  States that she was unable to sleep and therefore put a neck support under her knee. Pain Assessment Currently in Pain?: Yes Pain Score: 3  Pain Location: Knee Pain Orientation: Right Pain Type: Chronic pain    Exercise/Treatments   Aerobic Stationary Bike: 8:00 full rotation,.   Standing Heel Raises: 10 reps Knee Flexion: 10 reps Terminal Knee Extension: 10 reps Forward Step Up: 10 reps Rocker Board: 2 minutes Other Standing Knee Exercises: squat to pick up ball off 4" step raise up onto toes x 5    Supine Heel Slides: 10 reps Terminal Knee Extension: 10 reps Knee Extension: PROM Knee Flexion: PROM   Manual Therapy Other Manual Therapy: manual techniques used to decrease swelling and pain.  Physical Therapy Assessment and Plan PT Assessment and Plan Clinical Impression Statement: Pt to department wtin much better gait.  Therapist counseled pt not to sleep with anything under her knee.  Pt completed standing activities without UE assise with therapist facilitation.; PT Plan: Continue with strengthening and swelling reducing     Goals  progressing  Problem List Patient Active Problem List   Diagnosis Date Noted  . Stiffness of joint, not elsewhere classified, lower leg 12/24/2013  . Difficulty in walking(719.7) 12/24/2013  . Decreased strength involving knee joint 12/24/2013  . Pain in joint, lower leg 12/24/2013  . ABDOMINAL BLOATING 12/14/2009  . ABDOMINAL PAIN, GENERALIZED 12/14/2009  . EPIGASTRIC PAIN 12/13/2009       GP    RUSSELL,CINDY 01/01/2014, 4:30  PM

## 2014-01-04 ENCOUNTER — Ambulatory Visit (HOSPITAL_COMMUNITY)
Admission: RE | Admit: 2014-01-04 | Discharge: 2014-01-04 | Disposition: A | Discharge status: Home / Self Care | Payer: 59 | Source: Ambulatory Visit | Attending: Orthopedic Surgery | Admitting: Orthopedic Surgery

## 2014-01-04 ENCOUNTER — Inpatient Hospital Stay (HOSPITAL_COMMUNITY)
Admission: RE | Admit: 2014-01-04 | Discharge: 2014-01-04 | Disposition: A | Discharge status: Home / Self Care | Payer: 59 | Source: Ambulatory Visit | Attending: Physical Therapy | Admitting: Physical Therapy

## 2014-01-04 DIAGNOSIS — R29898 Other symptoms and signs involving the musculoskeletal system: Secondary | ICD-10-CM

## 2014-01-04 DIAGNOSIS — R262 Difficulty in walking, not elsewhere classified: Secondary | ICD-10-CM

## 2014-01-04 DIAGNOSIS — M25669 Stiffness of unspecified knee, not elsewhere classified: Secondary | ICD-10-CM

## 2014-01-04 DIAGNOSIS — M25569 Pain in unspecified knee: Secondary | ICD-10-CM

## 2014-01-04 NOTE — Progress Notes (Signed)
Physical Therapy Treatment Patient Details  Name: Sydney Holloway MRN: [id number] Date of Birth: 30-Oct-1976  Today's Date: 01/04/2014 Time: 4098-11911345-1425 PT Time Calculation (min): 40 min Charge 1345-1415; manual [id number] Visit#: 5 of 12  Re-eval: 01/23/14   Authorization: [employer]   Subjective: Symptoms/Limitations Symptoms: Pt states her knee is still swollen and still wants to catch. Pain Assessment Pain Score: 5  Pain Location: Knee Pain Orientation: Right  Exercise/Treatments  Stretches Active Hamstring Stretch: 3 reps;30 seconds Gastroc Stretch Limitations: slant board 30" x 2 Aerobic Stationary Bike: 8:00 L4 seat at 6   Standing Forward Lunges: 10 reps Lateral Step Up: 10 reps Forward Step Up: 10 reps Functional Squat: 10 reps Rocker Board: 2 minutes SLS with Vectors: 3x 10" Other Standing Knee Exercises: squat to 4" step pick up ball then up on toes x 15.   Supine Heel Slides: 10 reps Terminal Knee Extension: 10 reps Knee Extension: PROM Knee Flexion: PROM  Manual Therapy Other Manual Therapy: manual technique to decease swelling and address scar tissue under incisions.   Physical Therapy Assessment and Plan PT Assessment and Plan Clinical Impression Statement: Pt counseled that she needed to ice her knee (pt c/o swelling but has not been icing), added new exercises with therapist facilitation.  Pt demonstrates impoved balance today .  Noted scar tissue build up under scars with manual pt was urged to complete self massage to these areas.  PT Plan: Continue with strengthening and swelling reducing        Problem List Patient Active Problem List   Diagnosis Date Noted  . Stiffness of joint, not elsewhere classified, lower leg 12/24/2013  . Difficulty in walking(719.7) 12/24/2013  . Decreased strength involving knee joint 12/24/2013  . Pain in joint, lower leg 12/24/2013  . ABDOMINAL BLOATING 12/14/2009  . ABDOMINAL PAIN, GENERALIZED 12/14/2009  .  EPIGASTRIC PAIN 12/13/2009     GP    RUSSELL,CINDY 01/04/2014, 3:32 PM

## 2014-01-06 ENCOUNTER — Ambulatory Visit (HOSPITAL_COMMUNITY)
Admission: RE | Admit: 2014-01-06 | Discharge: 2014-01-06 | Disposition: A | Discharge status: Home / Self Care | Payer: 59 | Source: Ambulatory Visit | Attending: Pulmonary Disease | Admitting: Pulmonary Disease

## 2014-01-06 ENCOUNTER — Inpatient Hospital Stay (HOSPITAL_COMMUNITY)
Admission: RE | Admit: 2014-01-06 | Discharge: 2014-01-06 | Disposition: A | Discharge status: Home / Self Care | Payer: 59 | Source: Ambulatory Visit | Attending: *Deleted | Admitting: *Deleted

## 2014-01-06 NOTE — Progress Notes (Signed)
Physical Therapy Treatment Patient Details  Name: Sydney Holloway MRN: [id number] Date of Birth: 1976-08-18  Today's Date: 01/06/2014 Time: 4098-11911535-1605 PT Time Calculation (min): 30 min Charges: Therex x [phone]'(1535-1557) Manual x [phone]'(1557-1608)  Visit#: 6 of 12  Re-eval: 01/23/14  Authorization: [employer]   Subjective: Symptoms/Limitations Symptoms: Pt states that she has been doing her exercises "a little" at home. Pain Assessment Currently in Pain?: Yes Pain Score: 5  Pain Location: Knee Pain Orientation: Right  Exercise/Treatments Standing Heel Raises: 10 reps;Limitations Heel Raises Limitations: Toe raises x 10 Knee Flexion: 10 reps Lateral Step Up: 10 reps;Right;Step Height: 4";Hand Hold: 2 Forward Step Up: 10 reps;Right;Step Height: 6";Hand Hold: 1 Functional Squat: 10 reps Rocker Board: 2 minutes Supine Quad Sets: 10 reps Short Arc Quad Sets: 10 reps Terminal Knee Extension: 10 reps Knee Extension: PROM Knee Flexion: PROM  Manual Therapy Other Manual Therapy: Manual techniques to decrease fascial restrictions and pain throughout right knee.  Physical Therapy Assessment and Plan PT Assessment and Plan Clinical Impression Statement: Continued to encourage pt to ice knee at least 1-2 times a day as she is still not using but continues to have inflammation. Pt requires multimodal cueing to facilitate distal quad contraction. Pt appears to tolerates treatment well. Pt reports pain decrease to 4/10 at end of session. PT Plan: Continue with strengthening and swelling reducing. Continue to encourage HEP compliance and cryotherapy at home.     Problem List Patient Active Problem List   Diagnosis Date Noted  . Stiffness of joint, not elsewhere classified, lower leg 12/24/2013  . Difficulty in walking(719.7) 12/24/2013  . Decreased strength involving knee joint 12/24/2013  . Pain in joint, lower leg 12/24/2013  . ABDOMINAL BLOATING 12/14/2009  . ABDOMINAL PAIN, GENERALIZED  12/14/2009  . EPIGASTRIC PAIN 12/13/2009    PT - End of Session Activity Tolerance: Patient tolerated treatment well General Behavior During Therapy: [facility] assessed/performed  Seth Bakeebekah Jong Rickman, PTA 01/06/2014, 4:37 PM

## 2014-01-08 ENCOUNTER — Ambulatory Visit (HOSPITAL_COMMUNITY)
Admission: RE | Admit: 2014-01-08 | Discharge: 2014-01-08 | Disposition: A | Discharge status: Home / Self Care | Payer: 59 | Source: Ambulatory Visit | Attending: Pulmonary Disease | Admitting: Pulmonary Disease

## 2014-01-08 DIAGNOSIS — R29898 Other symptoms and signs involving the musculoskeletal system: Secondary | ICD-10-CM

## 2014-01-08 DIAGNOSIS — M25669 Stiffness of unspecified knee, not elsewhere classified: Secondary | ICD-10-CM

## 2014-01-08 DIAGNOSIS — R262 Difficulty in walking, not elsewhere classified: Secondary | ICD-10-CM

## 2014-01-08 DIAGNOSIS — M25569 Pain in unspecified knee: Secondary | ICD-10-CM

## 2014-01-08 NOTE — Progress Notes (Signed)
Physical Therapy Treatment Patient Details  Name: Sydney Holloway MRN: [id number] Date of Birth: 09-12-1976  Today's Date: 01/08/2014 Time: 9449-6759 PT Time Calculation (min): 76 min Charge TE 1638-4665, 9935-7017 Manual 7939-0300  Visit#: 7 of 12  Re-eval: 01/23/14 Assessment Diagnosis: s/p Rt arthroscopic surgery Surgical Date: 12/03/13 Next MD Visit: Mardelle Matte 01/13/2014 Prior Therapy: none  Authorization: [employer]  Authorization Time Period:    Authorization Visit#:   of     Subjective: Symptoms/Limitations Symptoms: Pt reported compliance with HEP every other day, current pain scale 4/10 Pain Assessment Currently in Pain?: Yes Pain Score: 4  Pain Location: Knee Pain Orientation: Right  Objective:   Exercise/Treatments Standing Heel Raises: 15 reps;Limitations Heel Raises Limitations: Toe raises 15x Knee Flexion: 10 reps Lateral Step Up: Right;15 reps;Hand Hold: 1;Step Height: 4" Forward Step Up: Right;10 reps;Hand Hold: 1;Step Height: 6" Step Down: Right;5 reps;Step Height: 4";Step Height: 2" Other Standing Knee Exercises: squat to 4" step pick up ball then up on toes x 15. Supine Quad Sets: 10 reps;Limitations Quad Sets Limitations: 10" holds Short Arc Quad Sets: 10 reps Heel Slides: 10 reps Terminal Knee Extension: 10 reps  Manual Therapy Manual Therapy: Myofascial release Myofascial Release: MFR to reduce fascial restricitons medial portion Rt knee  Physical Therapy Assessment and Plan PT Assessment and Plan Clinical Impression Statement: Session focus on improving quad activation and gait training with emphasis on knee flexion and equal stance phase.  Manual techniques complete to reduce fascial restrictions and improve ROM.  Pt able to achieve  AROM 4-118, PROM 0-120.  Multimodal cueing to facilitate distal quad contraction and reduce compensation of other musculature.  Progressed to step down training for eccentric quad control wiht therapist facilitation to  improve form and technique.  Pt encouraged to increased frequency of HEP for maximum benefits and apply ice for pain and edema control, rated pain 4/10 at end of session.   PT Plan: Continue with strengthening and swelling reducing. Continue to encourage HEP compliance and cryotherapy at home.     Goals Home Exercise Program Pt/caregiver will Perform Home Exercise Program: For increased ROM;For increased strengthening PT Short Term Goals Time to Complete Short Term Goals: 2 weeks PT Short Term Goal 1: Pt to be able to sit for 30 minutes with knees bent with comfort to be able to go out to eat PT Short Term Goal 2: Pt to be albe to ambulate inside without a crutch PT Short Term Goal 2 - Progress: Met PT Short Term Goal 3: Pt to be able to stand for 20 minutes to perform part of choir  activity. PT Short Term Goal 4: Pt to be able to walk for 20 minutes for short shopping trips  PT Short Term Goal 5: Pt waking one time a night. PT Long Term Goals Time to Complete Long Term Goals: 4 weeks Long Term Goal 3 Progress: Met  Problem List Patient Active Problem List   Diagnosis Date Noted  . Stiffness of joint, not elsewhere classified, lower leg 12/24/2013  . Difficulty in walking(719.7) 12/24/2013  . Decreased strength involving knee joint 12/24/2013  . Pain in joint, lower leg 12/24/2013  . ABDOMINAL BLOATING 12/14/2009  . ABDOMINAL PAIN, GENERALIZED 12/14/2009  . EPIGASTRIC PAIN 12/13/2009    PT - End of Session Activity Tolerance: Patient tolerated treatment well General Behavior During Therapy: [facility] for tasks assessed/performed  GP    Aldona Lento 01/08/2014, 4:29 PM

## 2014-01-11 ENCOUNTER — Ambulatory Visit (HOSPITAL_COMMUNITY)
Admission: RE | Admit: 2014-01-11 | Discharge: 2014-01-11 | Disposition: A | Discharge status: Home / Self Care | Payer: 59 | Source: Ambulatory Visit | Attending: Pulmonary Disease | Admitting: Pulmonary Disease

## 2014-01-11 DIAGNOSIS — M25569 Pain in unspecified knee: Secondary | ICD-10-CM

## 2014-01-11 DIAGNOSIS — R262 Difficulty in walking, not elsewhere classified: Secondary | ICD-10-CM

## 2014-01-11 DIAGNOSIS — R29898 Other symptoms and signs involving the musculoskeletal system: Secondary | ICD-10-CM

## 2014-01-11 DIAGNOSIS — M25669 Stiffness of unspecified knee, not elsewhere classified: Secondary | ICD-10-CM

## 2014-01-11 NOTE — Progress Notes (Signed)
Physical Therapy Treatment Patient Details  Name: Sydney Holloway MRN: [id number] Date of Birth: 13-Apr-1976  Today's Date: 01/11/2014 Time: 4098-11911345-1425 PT Time Calculation (min): 40 min Charge there ex 1345-1410; manual 1410-1425 Visit#: 8 of 12    Subjective: Symptoms/Limitations Symptoms: Pt states she went to the mall on Saturday and had increased pain.  Pt did not ice.  Pt urged about the importance of icing. Pain Assessment Currently in Pain?: Yes Pain Score: 3  Pain Location: Knee Pain Orientation: Right Pain Type: Chronic pain   Exercise/Treatments Aerobic Elliptical: 5:00  Standing Forward Lunges: Right;Limitations Forward Lunges Limitations: onto bosu Lateral Step Up: Right;10 reps;Hand Hold: 0;Step Height: 6" Lunge Walking - Round Trips: 2 RT forward and side Stairs: 3 flights. Rocker Board: 2 minutes SLS with Vectors: 15" x 3 Other Standing Knee Exercises: squat to 2" step pick up ball then up on toes x 15. Other Standing Knee Exercises: lunge onto bosu ball x 10     Manual Therapy Other Manual Therapy: Manual techniques used to decrease swelling in knee.   Physical Therapy Assessment and Plan PT Assessment and Plan Clinical Impression Statement: Pt has decreased eccentric quad strength.  Pt introduced to new exercises to attempt to increase eccentric quad strength.   PT Treatment/Interventions: Therapeutic activities;Therapeutic exercise;Manual techniques;Patient/family education;Modalities;Gait training;Stair training PT Plan: begin quad machine with emphasis on slowly lowering    Goals    Problem List Patient Active Problem List   Diagnosis Date Noted  . Stiffness of joint, not elsewhere classified, lower leg 12/24/2013  . Difficulty in walking(719.7) 12/24/2013  . Decreased strength involving knee joint 12/24/2013  . Pain in joint, lower leg 12/24/2013  . ABDOMINAL BLOATING 12/14/2009  . ABDOMINAL PAIN, GENERALIZED 12/14/2009  . EPIGASTRIC PAIN  12/13/2009       GP    Marquest Gunkel,CINDY 01/11/2014, 3:24 PM

## 2014-01-13 ENCOUNTER — Ambulatory Visit (HOSPITAL_COMMUNITY)
Admission: RE | Admit: 2014-01-13 | Discharge: 2014-01-13 | Disposition: A | Discharge status: Home / Self Care | Payer: 59 | Source: Ambulatory Visit | Attending: Pulmonary Disease | Admitting: Pulmonary Disease

## 2014-01-13 NOTE — Progress Notes (Signed)
Physical Therapy Re-evaluation  Patient Details  Name: Sydney Holloway MRN: [id number] Date of Birth: 1976-05-30  Today's Date: 01/13/2014 Time: 6333-5456 PT Time Calculation (min): 38 min              Visit#: 9 of 16  Re-eval: 02/10/14 Assessment Diagnosis: s/p Rt arthroscopic surgery Surgical Date: 12/03/13 Next MD Visit: Mardelle Matte 01/13/2014 Prior Therapy: none Authorization: [employer]   Charges:  ROM/MMT 2563-8937 (10'), self care 1120-1145 (25')   Subjective Pt states she feels she is doing well but states frustration with inability to descend stairs reciprocally.  Pain varies 1-5/10.  Today, 2/10 in Rt knee.  States she has more stiffness after immobility rather than pain.  Objective: (measured by [employer], PT) RLE AROM (degrees) Right Knee Extension: 8 (was 15 degrees) Right Knee Flexion: 113 (was 86 degrees)  RLE Strength Right Hip Flexion: 4/5 (was 3/5) Right Hip Extension: 4/5 (was 4/5) Right Hip ABduction: 4/5 (was 3+/5) Right Hip ADduction: 5/5 (was 5/5) Right Knee Flexion: 4/5 (was 4/5) Right Knee Extension: 4/5 (was 3/5) Right Ankle Dorsiflexion: 4/5 (was 3+/5)   Physical Therapy Assessment and Plan PT Assessment and Plan Clinical Impression Statement: Pt has completed 9 PT visits following Rt knee arthroscopy and is progressing well toward goals.  Strength has increased 1/2-1 grade for Rt LE with overall improvement in ROM.  Pt continues to lack full strength, mainly eccentrically,  and AROM  8-113 degrees.  Functionally, she is progressing well with ADLs, however would like to be able to descend stairs reciprocally and be up on her feet longer without increasing Rt knee pain.  Pt has met all STG's and 2/6 LTG's.  Pt would benefit from continued therapy to reach all goals. PT Frequency: Min 2X/week PT Duration: 4 weeks PT Plan: Continue 2 x week  X 4 more weeks to address remaining LTG's.  Focus on increasing Rt LT eccentric strength.  Add  quad machine with  emphasis on slowly lowering, stair training.    Goals Home Exercise Program Pt/caregiver will Perform Home Exercise Program: For increased ROM;For increased strengthening PT Goal: Perform Home Exercise Program - Progress: Met PT Short Term Goals Time to Complete Short Term Goals: 2 weeks PT Short Term Goal 1: Pt to be able to sit for 30 minutes with knees bent with comfort to be able to go out to eat PT Short Term Goal 1 - Progress: Met PT Short Term Goal 2: Pt to be albe to ambulate inside without a crutch PT Short Term Goal 2 - Progress: Met PT Short Term Goal 3: Pt to be able to stand for 20 minutes to perform part of choir  activity. PT Short Term Goal 3 - Progress: Met PT Short Term Goal 4: Pt to be able to walk for 20 minutes for short shopping trips  PT Short Term Goal 4 - Progress: Met PT Short Term Goal 5: Pt waking one time a night. PT Short Term Goal 5 - Progress: Met PT Long Term Goals Time to Complete Long Term Goals: 4 weeks PT Long Term Goal 1 - Progress: Progressing toward goal PT Long Term Goal 2 - Progress: Met Long Term Goal 3 Progress: Met Long Term Goal 4 Progress: Progressing toward goal Long Term Goal 5 Progress: Progressing toward goal PT Long Term Goal 6: Pt sleep all night without waking-Progress:  progressing toward goals  Problem List Patient Active Problem List   Diagnosis Date Noted  . Stiffness of joint, not elsewhere  classified, lower leg 12/24/2013  . Difficulty in walking(719.7) 12/24/2013  . Decreased strength involving knee joint 12/24/2013  . Pain in joint, lower leg 12/24/2013  . ABDOMINAL BLOATING 12/14/2009  . ABDOMINAL PAIN, GENERALIZED 12/14/2009  . EPIGASTRIC PAIN 12/13/2009    General Behavior During Therapy: [facility] for tasks assessed/performed   Teena Irani, PTA/CLT 01/13/2014, 11:51 AM

## 2014-01-15 ENCOUNTER — Ambulatory Visit (HOSPITAL_COMMUNITY): Payer: 59 | Admitting: Physical Therapy

## 2014-01-18 ENCOUNTER — Ambulatory Visit (HOSPITAL_COMMUNITY): Payer: 59 | Admitting: Physical Therapy

## 2014-01-20 ENCOUNTER — Ambulatory Visit (HOSPITAL_COMMUNITY)
Admission: RE | Admit: 2014-01-20 | Discharge: 2014-01-20 | Disposition: A | Discharge status: Home / Self Care | Payer: 59 | Source: Ambulatory Visit | Attending: Physical Therapy | Admitting: Physical Therapy

## 2014-01-20 DIAGNOSIS — M25669 Stiffness of unspecified knee, not elsewhere classified: Secondary | ICD-10-CM

## 2014-01-20 DIAGNOSIS — R29898 Other symptoms and signs involving the musculoskeletal system: Secondary | ICD-10-CM

## 2014-01-20 DIAGNOSIS — M25569 Pain in unspecified knee: Secondary | ICD-10-CM

## 2014-01-20 DIAGNOSIS — R262 Difficulty in walking, not elsewhere classified: Secondary | ICD-10-CM

## 2014-01-20 NOTE — Progress Notes (Signed)
Physical Therapy Treatment Patient Details  Name: Sydney Holloway MRN: [id number] Date of Birth: Jan 22, 1976  Today's Date: 01/20/2014 Time: 7829-56211436-1525 PT Time Calculation (min): 49 min Charge TE 38 1436-1514, Ice 1515-1525  Visit#: 10 of 16  Re-eval: 02/10/14 Assessment Diagnosis: s/p Rt arthroscopic surgery Surgical Date: 12/03/13 Next MD Visit: Dion SaucierLandau March 2015 Prior Therapy: none  Authorization: [employer]  Authorization Time Period:    Authorization Visit#:   of     Subjective: Symptoms/Limitations Symptoms: [place] feels her walking is improving most difficulty continues to be going down stairs Pain Assessment Currently in Pain?: Yes Pain Score: 3  Pain Location: Knee Pain Orientation: Right  Objective:   Exercise/Treatments Stretches Gastroc Stretch Limitations: slant board 30" x 3 Aerobic Elliptical: 8' @ L1 Machines for Strengthening Cybex Knee Extension: 2PL Rt LE only 10x Standing Terminal Knee Extension: 15 reps;Theraband;Limitations Theraband Level (Terminal Knee Extension): Level 4 (Blue) Terminal Knee Extension Limitations: 5" holds Step Down: Right;15 reps;Hand Hold: 1;Step Height: 4" Stairs: 3 flights. Supine Patellar Mobs: Demonstrate, educated and pt able to demonstrate appropriate technique Prone  Prone Knee Hang: 3 minutes;Weights Prone Knee Hang Weights (lbs): 3#   Modalities Modalities: Cryotherapy Manual Therapy Manual Therapy: Joint mobilization Joint Mobilization: Patella mobs all directions and tib/fib Cryotherapy Number Minutes Cryotherapy: 10 Minutes Cryotherapy Location: Knee Type of Cryotherapy: Ice pack  Physical Therapy Assessment and Plan PT Assessment and Plan Clinical Impression Statement: Session focus on imporving eccentric quad musculature strengthening for increased ease while descending stairs.  Pt c/o knee "popping" during gait, pt educated and able to demonstrate patella mobs to assist with  correct trackinig.  Added cybex machines for quad strengthening with cueing for eccentric control.  Instructed prone knee hang to improve knee extension, pt able to tolerate for 3 minutes without c/o.  Ended session with ice for pain and edema control, pt encouraged to increase frequency with ice at home.   PT Plan: Focus on Rt LE eccentric strengthening.  Continue with cybex machines and stair training.      Goals Home Exercise Program Pt/caregiver will Perform Home Exercise Program: For increased ROM;For increased strengthening PT Short Term Goals Time to Complete Short Term Goals: 2 weeks PT Short Term Goal 1: Pt to be able to sit for 30 minutes with knees bent with comfort to be able to go out to eat PT Short Term Goal 2: Pt to be albe to ambulate inside without a crutch PT Short Term Goal 3: Pt to be able to stand for 20 minutes to perform part of choir  activity. PT Short Term Goal 4: Pt to be able to walk for 20 minutes for short shopping trips  PT Short Term Goal 5: Pt waking one time a night. PT Long Term Goals Time to Complete Long Term Goals: 4 weeks Long Term Goal 5 Progress: Progressing toward goal PT Long Term Goal 6: Pt sleep all night without waking-Progress:  progressing toward goals  Problem List Patient Active Problem List   Diagnosis Date Noted  . Stiffness of joint, not elsewhere classified, lower leg 12/24/2013  . Difficulty in walking(719.7) 12/24/2013  . Decreased strength involving knee joint 12/24/2013  . Pain in joint, lower leg 12/24/2013  . ABDOMINAL BLOATING 12/14/2009  . ABDOMINAL PAIN, GENERALIZED 12/14/2009  . EPIGASTRIC PAIN 12/13/2009    PT - End of Session Activity Tolerance: Patient tolerated treatment well General Behavior During Therapy: [facility] assessed/performed  GP    Jennye Moccasinockerham,  Ruby Cola 01/20/2014, 3:57 PM

## 2014-01-22 ENCOUNTER — Ambulatory Visit (HOSPITAL_COMMUNITY)
Admission: RE | Admit: 2014-01-22 | Discharge: 2014-01-22 | Disposition: A | Discharge status: Home / Self Care | Payer: 59 | Source: Ambulatory Visit | Attending: Pulmonary Disease | Admitting: Pulmonary Disease

## 2014-01-22 DIAGNOSIS — M25569 Pain in unspecified knee: Secondary | ICD-10-CM

## 2014-01-22 DIAGNOSIS — M25669 Stiffness of unspecified knee, not elsewhere classified: Secondary | ICD-10-CM

## 2014-01-22 DIAGNOSIS — R262 Difficulty in walking, not elsewhere classified: Secondary | ICD-10-CM

## 2014-01-22 DIAGNOSIS — R29898 Other symptoms and signs involving the musculoskeletal system: Secondary | ICD-10-CM

## 2014-01-22 NOTE — Progress Notes (Signed)
Physical Therapy Treatment Patient Details  Name: Sydney Holloway MRN: [id number] Date of Birth: [birth year]  Today's Date: 01/22/2014 Time: 4098-11911345-1425 PT Time Calculation (min): 40 min Charge there ex 1345-1425 Visit#: 11 of 16  Re-eval: 02/10/14    Authorization: [employer]  Subjective: Symptoms/Limitations Symptoms: Pt shas no complaint. Pain Assessment Currently in Pain?: Yes Pain Score: 2  Pain Location: Knee Pain Orientation: Right Pain Type: Chronic pain     Exercise/Treatments  Stretches Quad Stretch: 3 reps;30 seconds Gastroc Stretch Limitations: slant board 30" x 3 Aerobic Elliptical: L 4 10 minutes.  Standing Wall Squat: 10 reps Lunge Walking - Round Trips: 2 RT  Stairs: 2 flights. Walking with Sports Cord: forward and retro with both bands x 2 RT  Other Standing Knee Exercises: in/outs with ladder; side lunges with blue medicine ball (when feet together up on toes with ball in air) x 2 RT     Physical Therapy Assessment and Plan PT Assessment and Plan Clinical Impression Statement: Pt ROM is WFL at this time.;  Pt began ploymetric exercises  Pt began sports cord walking for improved strengthening . PT Plan: Test one max rep for both Hamstring and Quadricep next visit.     Goals  Progressing   Problem List Patient Active Problem List   Diagnosis Date Noted  . Stiffness of joint, not elsewhere classified, lower leg 12/24/2013  . Difficulty in walking(719.7) 12/24/2013  . Decreased strength involving knee joint 12/24/2013  . Pain in joint, lower leg 12/24/2013  . ABDOMINAL BLOATING 12/14/2009  . ABDOMINAL PAIN, GENERALIZED 12/14/2009  . EPIGASTRIC PAIN 12/13/2009       GP    RUSSELL,CINDY 01/22/2014, 4:25 PM

## 2014-01-25 ENCOUNTER — Ambulatory Visit (HOSPITAL_COMMUNITY)
Admission: RE | Admit: 2014-01-25 | Discharge: 2014-01-25 | Disposition: A | Discharge status: Home / Self Care | Payer: 59 | Source: Ambulatory Visit | Attending: Orthopedic Surgery | Admitting: Orthopedic Surgery

## 2014-01-25 DIAGNOSIS — M25669 Stiffness of unspecified knee, not elsewhere classified: Secondary | ICD-10-CM | POA: Insufficient documentation

## 2014-01-25 DIAGNOSIS — M25569 Pain in unspecified knee: Secondary | ICD-10-CM | POA: Insufficient documentation

## 2014-01-25 DIAGNOSIS — M6281 Muscle weakness (generalized): Secondary | ICD-10-CM | POA: Insufficient documentation

## 2014-01-25 DIAGNOSIS — M25659 Stiffness of unspecified hip, not elsewhere classified: Secondary | ICD-10-CM | POA: Insufficient documentation

## 2014-01-25 DIAGNOSIS — IMO0001 Reserved for inherently not codable concepts without codable children: Secondary | ICD-10-CM | POA: Insufficient documentation

## 2014-01-25 DIAGNOSIS — R262 Difficulty in walking, not elsewhere classified: Secondary | ICD-10-CM | POA: Insufficient documentation

## 2014-01-25 NOTE — Progress Notes (Signed)
Physical Therapy Treatment Patient Details  Name: Sydney Holloway MRN: [id number] Date of Birth: 29-Mar-1976  Today's Date: 01/25/2014 Time: 5366-44031440-1518 PT Time Calculation (min): 38 min Charges: Therex x 38'  Visit#: 12 of 16  Re-eval: 02/10/14  Authorization: [employer]    Subjective: Symptoms/Limitations Symptoms: Pt reports HEP compliance. Pain Assessment Currently in Pain?: Yes Pain Score: 4  Pain Location: Knee Pain Orientation: Right  Objective: Functional Tests Functional Tests: Quad 1 rep max RLE is 60% of LLE; Hamstring 1 rep max RLE is 62% of LLE  Exercise/Treatments Aerobic Elliptical: 8'@L4  to improve LE strength and activity tolerance Machines for Strengthening Cybex Knee Extension: 2pl x10 RLE only Cybex Knee Flexion: 3pl x 10 RLE only Standing Lateral Step Up: 10 reps;Step Height: 4";Hand Hold: 1;Right Step Down: 10 reps;Step Height: 4";Hand Hold: 1;Right Wall Squat: 10 reps;10 seconds Walking with Sports Cord: Forward and retro with both bands x 2 RT    Physical Therapy Assessment and Plan PT Assessment and Plan Clinical Impression Statement: Pt completes therex well after initial cueing and demo. One rep max measured this session. Pt continues to have most difficulty with eccentric quadricep contraction. Pt requires multimodal cueing to improve form with wall squats. Pt reports pain decrease to 2/10 at end for session. PT Plan: Continue to progres LE strength and stability per PT POC.     Problem List Patient Active Problem List   Diagnosis Date Noted  . Stiffness of joint, not elsewhere classified, lower leg 12/24/2013  . Difficulty in walking(719.7) 12/24/2013  . Decreased strength involving knee joint 12/24/2013  . Pain in joint, lower leg 12/24/2013  . ABDOMINAL BLOATING 12/14/2009  . ABDOMINAL PAIN, GENERALIZED 12/14/2009  . EPIGASTRIC PAIN 12/13/2009    PT - End of Session Activity Tolerance: Patient tolerated treatment well General Behavior  During Therapy: [facility] for tasks assessed/performed  Seth Bakeebekah Djibril Glogowski, PTA  01/25/2014, 5:11 PM

## 2014-01-27 ENCOUNTER — Ambulatory Visit (HOSPITAL_COMMUNITY)
Admission: RE | Admit: 2014-01-27 | Discharge: 2014-01-27 | Disposition: A | Discharge status: Home / Self Care | Payer: 59 | Source: Ambulatory Visit | Attending: Pulmonary Disease | Admitting: Pulmonary Disease

## 2014-01-27 DIAGNOSIS — R29898 Other symptoms and signs involving the musculoskeletal system: Secondary | ICD-10-CM

## 2014-01-27 DIAGNOSIS — M25569 Pain in unspecified knee: Secondary | ICD-10-CM

## 2014-01-27 DIAGNOSIS — M25669 Stiffness of unspecified knee, not elsewhere classified: Secondary | ICD-10-CM

## 2014-01-27 DIAGNOSIS — R262 Difficulty in walking, not elsewhere classified: Secondary | ICD-10-CM

## 2014-01-27 NOTE — Progress Notes (Signed)
Physical Therapy Treatment Patient Details  Name: Sydney Holloway MRN: [id number] Date of Birth: June 18, 1976  Today's Date: 01/27/2014 Time: 1440-1520 PT Time Calculation (min): 40 min Charge there ex 1440-1520 Visit#: 13 of 16  Re-eval: 02/10/14    Authorization: [employer]  Subjective: Symptoms/Limitations Symptoms: Pt has no complaints Pain Assessment Currently in Pain?: Yes Pain Score: 2  Pain Location: Knee Pain Orientation: Right Pain Type: Chronic pain     Exercise/Treatments     Machines for Strengthening Cybex Knee Extension: 2 PL x 10 (1 max rep  Rt 22.5 #; Lt 50 #) Cybex Knee Flexion: 3pl x 10 RLE only (1 max rep Hamstring  Rt 27#; Lt 54#)   Standing Lateral Step Up: Right;15 reps;Hand Hold: 0;Step Height: 8" Forward Step Up: Right;15 reps;Hand Hold: 0;Step Height: 8" Lunge Walking - Round Trips: 2 RT Walking with Sports Cord: Forward and retro with both bands x 2 RT  Other Standing Knee Exercises: Tree x 1 min x 2  Other Standing Knee Exercises: side lunge with blue medicine ball up on toes with arms up in air when feet come together x 2 RT      Physical Therapy Assessment and Plan PT Assessment and Plan Clinical Impression Statement: Pt completed 1 max reps Rt quad is 45% of LT; Rt hamstring is 50% of LT.   PT Plan: begin isokinetic next treatment.    Goals  progressing  Problem List Patient Active Problem List   Diagnosis Date Noted  . Stiffness of joint, not elsewhere classified, lower leg 12/24/2013  . Difficulty in walking(719.7) 12/24/2013  . Decreased strength involving knee joint 12/24/2013  . Pain in joint, lower leg 12/24/2013  . ABDOMINAL BLOATING 12/14/2009  . ABDOMINAL PAIN, GENERALIZED 12/14/2009  . EPIGASTRIC PAIN 12/13/2009       GP    Kamya Watling,CINDY 01/27/2014, 4:56 PM

## 2014-01-29 ENCOUNTER — Ambulatory Visit (HOSPITAL_COMMUNITY)
Admission: RE | Admit: 2014-01-29 | Discharge: 2014-01-29 | Disposition: A | Discharge status: Home / Self Care | Payer: 59 | Source: Ambulatory Visit

## 2014-01-29 DIAGNOSIS — R29898 Other symptoms and signs involving the musculoskeletal system: Secondary | ICD-10-CM

## 2014-01-29 DIAGNOSIS — M25669 Stiffness of unspecified knee, not elsewhere classified: Secondary | ICD-10-CM

## 2014-01-29 DIAGNOSIS — R262 Difficulty in walking, not elsewhere classified: Secondary | ICD-10-CM

## 2014-01-29 DIAGNOSIS — M25569 Pain in unspecified knee: Secondary | ICD-10-CM

## 2014-01-29 NOTE — Progress Notes (Signed)
Physical Therapy Treatment Patient Details  Name: Sydney Holloway MRN: [id number] Date of Birth: 1976/03/10  Today's Date: 01/29/2014 Time: 4098-11911645-1730 PT Time Calculation (min): 45 min Charge: TE [id number]  Visit#: 14 of 16  Re-eval: 02/10/14 Assessment Diagnosis: s/p Rt arthroscopic surgery Surgical Date: 12/03/13 Next MD Visit: Sydney Holloway February 10, 2014 Prior Therapy: none  Authorization: [employer]  Authorization Time Period:    Authorization Visit#:   of     Subjective: Symptoms/Limitations Symptoms: Currently pain free today, most difficulty with stairs and knee flexion in general. Pain Assessment Currently in Pain?: No/denies  Objective:   Exercise/Treatments Aerobic Isokinetic: 165<-> 150<-> 135 <-> 120 2 sets x 10 reps Machines for Strengthening Cybex Knee Extension: 2 PL x 15 Rt only Cybex Knee Flexion: 3pl x 15 RLE only Standing Lateral Step Up: Right;15 reps;Hand Hold: 0;Step Height: 6" Forward Step Up: Right;15 reps;Hand Hold: 0;Step Height: 8";Limitations Forward Step Up Limitations: Step up then heel raise x 5" Step Down: Right;15 reps;Hand Hold: 0;Step Height: 6" Lunge Walking - Round Trips: 2 RT Walking with Sports Cord: Forward and retro with both bands x 2 RT  Other Standing Knee Exercises: Tree x 1 min x 2  Other Standing Knee Exercises: side lunge with blue weighted ball, squat and heel raise 2RT    Physical Therapy Assessment and Plan PT Assessment and Plan Clinical Impression Statement: Began biodex isokinetic exercises and progressed functional strengthening exercises.  Pt able to demonstrate improved eccentric quad control descending on increased step height, therapist facilitation to reduce compensation and increase knee flexion.  Pt fatigued at end of session  Pt encouraged to apply ice at home for pain and edema control.   PT Plan: Continue with current POC to improve functional strengthening, increase ease with stairs and knee flexion.       Goals Home Exercise Program Pt/caregiver will Perform Home Exercise Program: For increased ROM;For increased strengthening PT Short Term Goals Time to Complete Short Term Goals: 2 weeks PT Short Term Goal 1: Pt to be able to sit for 30 minutes with knees bent with comfort to be able to go out to eat PT Short Term Goal 2: Pt to be albe to ambulate inside without a crutch PT Short Term Goal 3: Pt to be able to stand for 20 minutes to perform part of choir  activity. PT Short Term Goal 4: Pt to be able to walk for 20 minutes for short shopping trips  PT Short Term Goal 5: Pt waking one time a night. PT Long Term Goals Time to Complete Long Term Goals: 4 weeks Long Term Goal 4 Progress: Progressing toward goal Long Term Goal 5 Progress: Progressing toward goal PT Long Term Goal 6: Pt sleep all night without waking-Progress:  progressing toward goals  Problem List Patient Active Problem List   Diagnosis Date Noted  . Stiffness of joint, not elsewhere classified, lower leg 12/24/2013  . Difficulty in walking(719.7) 12/24/2013  . Decreased strength involving knee joint 12/24/2013  . Pain in joint, lower leg 12/24/2013  . ABDOMINAL BLOATING 12/14/2009  . ABDOMINAL PAIN, GENERALIZED 12/14/2009  . EPIGASTRIC PAIN 12/13/2009    PT - End of Session Activity Tolerance: Patient tolerated treatment well;Patient limited by fatigue General Behavior During Therapy: Sydney Holloway for tasks assessed/performed  GP    Sydney Holloway, Sydney Holloway 01/29/2014, 6:46 PM

## 2014-02-01 ENCOUNTER — Ambulatory Visit (HOSPITAL_COMMUNITY)
Admission: RE | Admit: 2014-02-01 | Discharge: 2014-02-01 | Disposition: A | Discharge status: Home / Self Care | Payer: 59 | Source: Ambulatory Visit | Attending: Pulmonary Disease | Admitting: Pulmonary Disease

## 2014-02-01 NOTE — Progress Notes (Signed)
Physical Therapy Treatment Patient Details  Name: Sydney Holloway MRN: [id number] Date of Birth: November 28, 1975  Today's Date: 02/01/2014 Time: 8657-84691438-1520 PT Time Calculation (min): 42 min Charges: Therex x [phone]'(1438-1508) Ice x 10'(1510-1520)  Visit#: 15 of 16  Re-eval: 02/10/14  Authorization: [employer]   Subjective: Symptoms/Limitations Symptoms: Pt states that pain has been elevated since last session. Pain Assessment Currently in Pain?: Yes Pain Score: 4  Pain Location: Knee Pain Orientation: Right  Precautions/Restrictions     Exercise/Treatments Aerobic Elliptical: 5'@L1  Machines for Strengthening Cybex Knee Extension:  (Attempted, too painful) Standing Forward Lunges: 10 reps Lateral Step Up: Right;15 reps;Hand Hold: 0;Step Height: 6" Rocker Board: 2 minutes;Limitations Rocker Board Limitations: right/left SLS with Vectors: 5x5" RLE  Manual Therapy Joint Mobilization: A/P joint mobs to right knee to decrease pain grade I-II Cryotherapy Number Minutes Cryotherapy: 10 Minutes Cryotherapy Location: Knee Type of Cryotherapy: Ice pack  Physical Therapy Assessment and Plan PT Assessment and Plan Clinical Impression Statement: Pt complains of increased pain this session. Avoided exercises which stressed patellar tendon or increased pain. Increased inflammation noted throughout knee. Ice applied at end of session to decreased inflammation.  PT Plan: Continue with current POC to improve functional strengthening, increase ease with stairs and knee flexion. Limit stress on patellar tendon until pain decreases.    Problem List Patient Active Problem List   Diagnosis Date Noted  . Stiffness of joint, not elsewhere classified, lower leg 12/24/2013  . Difficulty in walking(719.7) 12/24/2013  . Decreased strength involving knee joint 12/24/2013  . Pain in joint, lower leg 12/24/2013  . ABDOMINAL BLOATING 12/14/2009  . ABDOMINAL PAIN, GENERALIZED 12/14/2009  . EPIGASTRIC PAIN  12/13/2009    PT - End of Session Activity Tolerance: Patient tolerated treatment well;Patient limited by fatigue General Behavior During Therapy: [facility] tasks assessed/performed  Seth Bakeebekah Delvina Mizzell, PTA  02/01/2014, 4:41 PM

## 2014-02-03 ENCOUNTER — Ambulatory Visit (HOSPITAL_COMMUNITY)
Admission: RE | Admit: 2014-02-03 | Discharge: 2014-02-03 | Disposition: A | Discharge status: Home / Self Care | Payer: 59 | Source: Ambulatory Visit | Attending: Pulmonary Disease | Admitting: Pulmonary Disease

## 2014-02-03 NOTE — Progress Notes (Signed)
Physical Therapy Treatment Patient Details  Name: Sydney Holloway MRN: [id number] Date of Birth: 03-29-1976  Today's Date: 02/03/2014 Time: 4098-11911435-1515 PT Time Calculation (min): 40 min Charges: Therex x 47'([phone]'(1435-1505) Ultrasound x 0'(8657-84698'(1507-1515)  Visit#: 16 of 17  Re-eval: 02/10/14  Authorization: [employer]   Subjective: Symptoms/Limitations Symptoms: Pt states that knee pain continues to be elevated. Pain Assessment Currently in Pain?: Yes Pain Score: 4  Pain Location: Knee Pain Orientation: Right   Exercise/Treatments Stretches Quad Stretch: 2 reps;30 seconds Aerobic Stationary Bike: 6'@5 .0 to improve mobility/decrease pain Standing Rocker Board: 2 minutes;Limitations Rocker Board Limitations: right/left SLS with Vectors: 5x10" each   Modalities Modalities: Ultrasound Ultrasound Ultrasound Location: Right patellar tendon and medial knee Ultrasound Parameters: 3MHz 1.0w/cm2 pulsed x 8' Ultrasound Goals: Pain  Physical Therapy Assessment and Plan PT Assessment and Plan Clinical Impression Statement: Pt continues to be limited by knee pain. Continued with low intensity exercises secondary to pain and inflammation. Completed non-thermal ultrasound to right patellar tendon and lateral knee. Pt reports pain decrease to 1/10 following ultrasound. PT Plan: Reassess next session. Follow up regarding pain following ultrasound.    Problem List Patient Active Problem List   Diagnosis Date Noted  . Stiffness of joint, not elsewhere classified, lower leg 12/24/2013  . Difficulty in walking(719.7) 12/24/2013  . Decreased strength involving knee joint 12/24/2013  . Pain in joint, lower leg 12/24/2013  . ABDOMINAL BLOATING 12/14/2009  . ABDOMINAL PAIN, GENERALIZED 12/14/2009  . EPIGASTRIC PAIN 12/13/2009    PT - End of Session Activity Tolerance: Patient tolerated treatment well General Behavior During Therapy: [facility] for tasks assessed/performed   Seth Bakeebekah Leeandra Ellerson, PTA  02/03/2014, 5:05 PM

## 2014-02-05 ENCOUNTER — Ambulatory Visit (HOSPITAL_COMMUNITY)
Admission: RE | Admit: 2014-02-05 | Discharge: 2014-02-05 | Disposition: A | Discharge status: Home / Self Care | Payer: 59 | Source: Ambulatory Visit | Attending: Pulmonary Disease | Admitting: Pulmonary Disease

## 2014-02-05 DIAGNOSIS — R262 Difficulty in walking, not elsewhere classified: Secondary | ICD-10-CM

## 2014-02-05 DIAGNOSIS — M25669 Stiffness of unspecified knee, not elsewhere classified: Secondary | ICD-10-CM

## 2014-02-05 DIAGNOSIS — R29898 Other symptoms and signs involving the musculoskeletal system: Secondary | ICD-10-CM

## 2014-02-05 DIAGNOSIS — M25569 Pain in unspecified knee: Secondary | ICD-10-CM

## 2014-02-05 NOTE — Progress Notes (Addendum)
Physical Therapy Re-evaluation/Progress notes/ Discharge summary  Patient Details  Name: Sydney Holloway MRN: [id number] Date of Birth: 09/09/1976  Today's Date: 02/05/2014 Time: 7322-0254 PT Time Calculation (min): 55 min Charge: TE 2706-2376, MMT/ROM [phone], self care [phone], FOTO no charge              Visit#: 42 of 81  Re-eval: 02/10/14 Assessment Diagnosis: s/p Rt arthroscopic surgery Surgical Date: 12/03/13 Next MD Visit: Mardelle Matte February 10, 2014 Prior Therapy: none  Authorization: [employer]    Authorization Time Period:    Authorization Visit#:   of     Subjective Symptoms/Limitations Symptoms: Pt stated she felt good with the ultrasound last session, currently pain free  Pt reported most difficulty with descending stairs.   How long can you sit comfortably?: Able to sit for 8 hours with knees bent without difficutly (Pt has not sat with her knee bent she keeps it straight.) How long can you stand comfortably?: Able to stand for 45 minutes to an hour comfortably (Pt tends to shift her weight; she can stand 10 minutes.) How long can you walk comfortably?: Walking no AD for 1 1/2 hours comfortably (Pt is walking with one crutch able to walk for 5 minutes. ) Pain Assessment Currently in Pain?: No/denies  Sensation/Coordination/Flexibility/Functional Tests Functional Tests Functional Tests: foto 72 (foto 34) Functional Tests: Quad 1 rep max RLE is 47% of LLE; Hamstring 1 rep max RLE is 57% of LLE (Quad 1 rep max RLE is 60% of LLE; Hamstring 1 rep max RLE) 1 max rep decreased secondary to pain during arc when mm tested pt able to hold with full force Assessment RLE AROM (degrees) Right Knee Extension: 3 (was 8) Right Knee Flexion: 126 (was 113) RLE Strength Right Hip Flexion: 5/5 (was 4/5) Right Hip Extension: 5/5 (was 4/5) Right Hip ABduction: 5/5 (was 4/5) Right Hip ADduction: 5/5 (5/5) Right Knee Flexion: 5/5 (was 4/5) Right Knee Extension: 5/5 (4+/5 was 4/5) Right  Ankle Dorsiflexion: 5/5 (was 4/5)  Exercise/Treatments Standing Stairs: 2 flights reciprocal pattern  Supine Quad sets 10x Heel slides 10x Seated  Cybex- 1 rep max hamstrings and quads   Physical Therapy Assessment and Plan PT Assessment and Plan Clinical Impression Statement: Reeval complete wtih the following findings:  Pt has met all STG and LTGs.  Pt independent with HEP and able to demonstrate all exercises correctly without difficutly.  Improved AROM 3-126 and all musculature strength WFL.  Pt stated improved tolerance with sitting, standing and walking to complete daily tasks without pain.  Improved perceived functional abilities assessment score on FOTO.  Pt given advanced HEP to continue at home to improve distal quad strengthening for AROM and increased ease descending stairs.   PT Plan: D/C to HEP.      Goals Home Exercise Program Pt/caregiver will Perform Home Exercise Program: For increased ROM;For increased strengthening PT Goal: Perform Home Exercise Program - Progress: Met PT Short Term Goals Time to Complete Short Term Goals: 2 weeks PT Short Term Goal 1: Pt to be able to sit for 30 minutes with knees bent with comfort to be able to go out to eat PT Short Term Goal 1 - Progress: Met PT Short Term Goal 2: Pt to be albe to ambulate inside without a crutch PT Short Term Goal 2 - Progress: Met PT Short Term Goal 3: Pt to be able to stand for 20 minutes to perform part of choir  activity. PT Short Term Goal 3 - Progress: Met  PT Short Term Goal 4: Pt to be able to walk for 20 minutes for short shopping trips  PT Short Term Goal 4 - Progress: Met PT Short Term Goal 5: Pt waking one time a night. PT Short Term Goal 5 - Progress: Met PT Long Term Goals Time to Complete Long Term Goals: 4 weeks PT Long Term Goal 1: I in advance Home exercise program PT Long Term Goal 2: Pt to be able to sit for 60 minutes at a time to perform work duties PT Long Term Goal 2 - Progress:  Met Long Term Goal 3: Pt to be able to walk in and outside without crutch Long Term Goal 3 Progress: Met Long Term Goal 4: Pt to be able to stand for an hour at a time to perform choir duties Long Term Goal 4 Progress: Met PT Long Term Goal 5: Pt to be able to walk for 60 mintues for good health habits Long Term Goal 5 Progress: Met Additional PT Long Term Goals?: Yes PT Long Term Goal 6: Pt sleep all night without wakingl  Problem List Patient Active Problem List   Diagnosis Date Noted  . Stiffness of joint, not elsewhere classified, lower leg 12/24/2013  . Difficulty in walking(719.7) 12/24/2013  . Decreased strength involving knee joint 12/24/2013  . Pain in joint, lower leg 12/24/2013  . ABDOMINAL BLOATING 12/14/2009  . ABDOMINAL PAIN, GENERALIZED 12/14/2009  . EPIGASTRIC PAIN 12/13/2009    PT - End of Session Activity Tolerance: Patient tolerated treatment well General Behavior During Therapy: WFL for tasks assessed/performed PT Plan of Care PT Home Exercise Plan: given  GP    Aldona Lento 02/05/2014, 4:55 PM  Physician Documentation Your signature is required to indicate approval of the treatment plan as stated above.  Please sign and either send electronically or make a copy of this report for your files and return this physician signed original.   Please mark one 1.__approve of plan  2. ___approve of plan with the following conditions.   ______________________________                                                          _____________________ Physician Signature                                                                                                             Date

## 2014-02-08 ENCOUNTER — Inpatient Hospital Stay (HOSPITAL_COMMUNITY): Admission: RE | Admit: 2014-02-08 | Payer: 59 | Source: Ambulatory Visit

## 2014-02-10 ENCOUNTER — Ambulatory Visit (HOSPITAL_COMMUNITY): Payer: 59 | Admitting: Physical Therapy

## 2014-02-12 ENCOUNTER — Ambulatory Visit (HOSPITAL_COMMUNITY): Payer: 59

## 2014-11-02 ENCOUNTER — Other Ambulatory Visit (HOSPITAL_COMMUNITY): Payer: Self-pay | Admitting: Pulmonary Disease

## 2014-11-02 ENCOUNTER — Ambulatory Visit (HOSPITAL_COMMUNITY)
Admission: RE | Admit: 2014-11-02 | Discharge: 2014-11-02 | Disposition: A | Discharge status: Home / Self Care | Payer: BC Managed Care – PPO | Source: Ambulatory Visit | Attending: Pulmonary Disease | Admitting: Pulmonary Disease

## 2014-11-02 DIAGNOSIS — R079 Chest pain, unspecified: Secondary | ICD-10-CM | POA: Diagnosis not present

## 2014-11-02 DIAGNOSIS — I2699 Other pulmonary embolism without acute cor pulmonale: Secondary | ICD-10-CM

## 2014-11-02 MED ORDER — IOHEXOL 350 MG/ML SOLN
100.0000 mL | Freq: Once | INTRAVENOUS | Status: AC | PRN
  Administered 2014-11-02: 100 mL via INTRAVENOUS

## 2014-11-12 LAB — IRON,TIBC AND FERRITIN PANEL
%SAT: 11
Iron: 28
TIBC: 258
UIBC: 230

## 2015-03-02 ENCOUNTER — Other Ambulatory Visit: Payer: Self-pay | Admitting: Obstetrics and Gynecology

## 2015-03-03 LAB — CYTOLOGY - PAP

## 2016-01-02 ENCOUNTER — Other Ambulatory Visit (HOSPITAL_COMMUNITY): Payer: Self-pay | Admitting: Pulmonary Disease

## 2016-01-02 ENCOUNTER — Ambulatory Visit (HOSPITAL_COMMUNITY)
Admission: RE | Admit: 2016-01-02 | Discharge: 2016-01-02 | Disposition: A | Discharge status: Home / Self Care | Payer: Commercial Managed Care - HMO | Source: Ambulatory Visit | Attending: Pulmonary Disease | Admitting: Pulmonary Disease

## 2016-01-02 DIAGNOSIS — M79672 Pain in left foot: Secondary | ICD-10-CM | POA: Diagnosis present

## 2016-01-02 DIAGNOSIS — M2012 Hallux valgus (acquired), left foot: Secondary | ICD-10-CM | POA: Insufficient documentation

## 2016-02-26 ENCOUNTER — Emergency Department (HOSPITAL_COMMUNITY)
Admission: EM | Admit: 2016-02-26 | Discharge: 2016-02-26 | Disposition: A | Discharge status: Home / Self Care | Payer: BLUE CROSS/BLUE SHIELD | Attending: Emergency Medicine | Admitting: Emergency Medicine

## 2016-02-26 ENCOUNTER — Encounter (HOSPITAL_COMMUNITY): Payer: Self-pay | Admitting: Emergency Medicine

## 2016-02-26 DIAGNOSIS — Z79899 Other long term (current) drug therapy: Secondary | ICD-10-CM | POA: Diagnosis not present

## 2016-02-26 DIAGNOSIS — M79662 Pain in left lower leg: Secondary | ICD-10-CM | POA: Insufficient documentation

## 2016-02-26 DIAGNOSIS — Z791 Long term (current) use of non-steroidal anti-inflammatories (NSAID): Secondary | ICD-10-CM | POA: Insufficient documentation

## 2016-02-26 DIAGNOSIS — M79605 Pain in left leg: Secondary | ICD-10-CM

## 2016-02-26 HISTORY — DX: Acute embolism and thrombosis of unspecified deep veins of unspecified lower extremity: I82.409

## 2016-02-26 HISTORY — DX: Other pulmonary embolism without acute cor pulmonale: I26.99

## 2016-02-26 MED ORDER — ENOXAPARIN SODIUM 100 MG/ML ~~LOC~~ SOLN
1.5000 mg/kg | Freq: Once | SUBCUTANEOUS | Status: AC
  Administered 2016-02-26: 100 mg via SUBCUTANEOUS
  Filled 2016-02-26: qty 1

## 2016-02-26 NOTE — ED Notes (Signed)
Pt reports lateral thigh to lateral hip pain since yesterday. Pain when walking. No calf pain. Legs look and feel equal bilaterally.

## 2016-02-26 NOTE — ED Provider Notes (Signed)
CSN: [id number]     Arrival date & time 02/26/16  4098 History  By signing my name below, I, Ronney Lion, attest that this documentation has been prepared under the direction and in the presence of Benjiman Core, MD. Electronically Signed: Ronney Lion, ED Scribe. 02/26/2016. 10:14 AM.   No chief complaint on file.  The history is provided by the patient. No language interpreter was used.   HPI Comments: Sydney Holloway is a 40 y.o. female with a history of right leg DVT and PE, who presents to the Emergency Department complaining of atraumatic, gradual-onset, constant, gradually worsening left leg pain from her hip to her knee that began yesterday. She states her pain started out as mild in severity before gradually increasing through the day. She denies any recent strenuous active. Sitting and turning while lying on her hip exacrbates her pain. Patient states she called and talked to her PCP, Dr. Juanetta Gosling, this morning and was sent to the ED due to her history of blood clots. She states she developed a right leg blood clot that then moved to her lungs. She notes a history of BCP use but states she discontinued it and has not had any blood clots since. Patient states she drove to [place] recently for a cruise to the [place]. She states she is not currently on anticoagulants. She notes a family history of a thrombotic disorder (patient unsure which), although she was tested for the same disorder and was negative for it. She denies a history of atrial fibrillation. She states she has not done any physical activity in the past few days. She denies any chest pain, SOB. Treated with Lovenox last time she was here. Patient denies any known pregnancy at this time. Patient h   Past Medical History  Diagnosis Date  . DVT of leg (deep venous thrombosis) (HCC)     right  . Pulmonary embolus (HCC)     bilateral lungs   Past Surgical History  Procedure Laterality Date  . Cesarean section    . Knee surgery      History reviewed. No pertinent family history. Social History  Substance Use Topics  . Smoking status: Never Smoker   . Smokeless tobacco: None  . Alcohol Use: No   OB History    No data available     Review of Systems  All other systems reviewed and are negative.     Allergies  Review of patient's allergies indicates no known allergies.  Home Medications   Prior to Admission medications   Medication Sig Start Date End Date Taking? Authorizing Provider  furosemide (LASIX) 40 MG tablet TK 1 T PO QD 12/08/15  Yes Historical Provider, MD  ibuprofen (ADVIL,MOTRIN) 200 MG tablet Take 400 mg by mouth every 6 (six) hours as needed.   Yes Historical Provider, MD  topiramate (TOPAMAX) 50 MG tablet Take 150 mg by mouth 2 (two) times daily.  02/25/16  Yes Historical Provider, MD   BP 117/87 mmHg  Pulse 80  Temp(Src) 98.1 F (36.7 C) (Oral)  Resp 16  Ht 5' (1.524 m)  Wt 147 lb (66.679 kg)  BMI 28.71 kg/m2  SpO2 100%  LMP 02/12/2016 Physical Exam  Constitutional: She is oriented to person, place, and time. She appears well-developed and well-nourished. No distress.  HENT:  Head: Normocephalic and atraumatic.  Cardiovascular: Normal rate and regular rhythm.   Pulmonary/Chest: Effort normal and breath sounds normal.  Abdominal: Soft. She exhibits no distension. There is no guarding.  Musculoskeletal: Normal range of motion. She exhibits tenderness. She exhibits no edema.  LLE: Tenderness over distal hamstrings on the left leg. Good cap refill (<2 seconds). Strong DP pulse on the left foot (2+). No swelling.   Neurological: She is alert and oriented to person, place, and time.  Skin: Skin is warm and dry.  Nursing note and vitals reviewed.   ED Course  Procedures (including critical care time)  DIAGNOSTIC STUDIES: Oxygen Saturation is 100% on RA, normal by my interpretation.    COORDINATION OF CARE: 10:14 AM - Discussed treatment plan with pt at bedside which includes  Lovenox injection. Pt verbalized understanding and agreed to plan.   Labs Review Labs Reviewed - No data to display  Imaging Review No results found. I have personally reviewed and evaluated these images and lab results as part of my medical decision-making.   EKG Interpretation None      MDM   Final diagnoses:  None   patient with leg pain. Appears to be likely musculoskeletal low but has history of blood clots. No signs or symptoms of PE. Will give Lovenox and discharge her Doppler tomorrow.  I personally performed the services described in this documentation, which was scribed in my presence. The recorded information has been reviewed and is accurate.       Benjiman CoreNathan Destyne Goodreau, MD 02/26/16 1050

## 2016-02-26 NOTE — Discharge Instructions (Signed)
Follow-up tomorrow for the ultrasound. Return sooner if started having chest pain or shortness of breath.

## 2016-02-27 ENCOUNTER — Ambulatory Visit (HOSPITAL_COMMUNITY)
Admission: RE | Admit: 2016-02-27 | Discharge: 2016-02-27 | Disposition: A | Discharge status: Home / Self Care | Payer: BLUE CROSS/BLUE SHIELD | Source: Ambulatory Visit | Attending: Emergency Medicine | Admitting: Emergency Medicine

## 2016-02-27 ENCOUNTER — Other Ambulatory Visit (HOSPITAL_COMMUNITY): Payer: Self-pay | Admitting: Emergency Medicine

## 2016-02-27 DIAGNOSIS — M79605 Pain in left leg: Secondary | ICD-10-CM | POA: Insufficient documentation

## 2016-04-06 ENCOUNTER — Other Ambulatory Visit: Payer: Self-pay | Admitting: Obstetrics and Gynecology

## 2016-04-10 LAB — CYTOLOGY - PAP

## 2017-01-24 ENCOUNTER — Other Ambulatory Visit (HOSPITAL_COMMUNITY): Payer: Self-pay | Admitting: Pulmonary Disease

## 2017-01-24 DIAGNOSIS — M7989 Other specified soft tissue disorders: Secondary | ICD-10-CM

## 2017-01-25 ENCOUNTER — Ambulatory Visit (HOSPITAL_COMMUNITY)
Admission: RE | Admit: 2017-01-25 | Discharge: 2017-01-25 | Disposition: A | Discharge status: Home / Self Care | Payer: 59 | Source: Ambulatory Visit | Attending: Pulmonary Disease | Admitting: Pulmonary Disease

## 2017-01-25 ENCOUNTER — Ambulatory Visit (HOSPITAL_COMMUNITY): Payer: BLUE CROSS/BLUE SHIELD

## 2017-01-25 DIAGNOSIS — M7989 Other specified soft tissue disorders: Secondary | ICD-10-CM | POA: Diagnosis not present

## 2017-01-25 DIAGNOSIS — M79605 Pain in left leg: Secondary | ICD-10-CM | POA: Insufficient documentation

## 2017-02-20 DIAGNOSIS — N912 Amenorrhea, unspecified: Secondary | ICD-10-CM | POA: Diagnosis not present

## 2017-02-26 ENCOUNTER — Other Ambulatory Visit: Payer: Self-pay | Admitting: Obstetrics and Gynecology

## 2017-02-26 DIAGNOSIS — N925 Other specified irregular menstruation: Secondary | ICD-10-CM | POA: Diagnosis not present

## 2017-02-26 DIAGNOSIS — Z3490 Encounter for supervision of normal pregnancy, unspecified, unspecified trimester: Secondary | ICD-10-CM | POA: Diagnosis not present

## 2017-02-26 DIAGNOSIS — Z113 Encounter for screening for infections with a predominantly sexual mode of transmission: Secondary | ICD-10-CM | POA: Diagnosis not present

## 2017-02-28 ENCOUNTER — Ambulatory Visit (HOSPITAL_COMMUNITY)
Admission: RE | Admit: 2017-02-28 | Discharge: 2017-02-28 | Disposition: A | Discharge status: Home / Self Care | Payer: 59 | Source: Ambulatory Visit | Attending: Pulmonary Disease | Admitting: Pulmonary Disease

## 2017-02-28 ENCOUNTER — Other Ambulatory Visit (HOSPITAL_COMMUNITY): Payer: Self-pay | Admitting: Pulmonary Disease

## 2017-02-28 DIAGNOSIS — M79605 Pain in left leg: Secondary | ICD-10-CM | POA: Diagnosis not present

## 2017-02-28 DIAGNOSIS — M7989 Other specified soft tissue disorders: Secondary | ICD-10-CM | POA: Insufficient documentation

## 2017-02-28 DIAGNOSIS — I82432 Acute embolism and thrombosis of left popliteal vein: Secondary | ICD-10-CM | POA: Insufficient documentation

## 2017-02-28 DIAGNOSIS — O2230 Deep phlebothrombosis in pregnancy, unspecified trimester: Secondary | ICD-10-CM

## 2017-02-28 HISTORY — DX: Deep phlebothrombosis in pregnancy, unspecified trimester: O22.30

## 2017-03-15 DIAGNOSIS — Z3480 Encounter for supervision of other normal pregnancy, unspecified trimester: Secondary | ICD-10-CM | POA: Diagnosis not present

## 2017-03-15 DIAGNOSIS — Z3481 Encounter for supervision of other normal pregnancy, first trimester: Secondary | ICD-10-CM | POA: Diagnosis not present

## 2017-03-15 DIAGNOSIS — O09521 Supervision of elderly multigravida, first trimester: Secondary | ICD-10-CM | POA: Diagnosis not present

## 2017-03-15 LAB — OB RESULTS CONSOLE RPR: RPR: NONREACTIVE

## 2017-03-15 LAB — OB RESULTS CONSOLE ABO/RH: RH Type: POSITIVE

## 2017-03-15 LAB — OB RESULTS CONSOLE HIV ANTIBODY (ROUTINE TESTING): HIV: NONREACTIVE

## 2017-03-15 LAB — OB RESULTS CONSOLE GC/CHLAMYDIA
CHLAMYDIA, DNA PROBE: NEGATIVE
GC PROBE AMP, GENITAL: NEGATIVE

## 2017-03-15 LAB — OB RESULTS CONSOLE RUBELLA ANTIBODY, IGM: Rubella: IMMUNE

## 2017-03-15 LAB — OB RESULTS CONSOLE HEPATITIS B SURFACE ANTIGEN: Hepatitis B Surface Ag: NEGATIVE

## 2017-03-15 LAB — OB RESULTS CONSOLE ANTIBODY SCREEN: Antibody Screen: NEGATIVE

## 2017-03-26 DIAGNOSIS — D649 Anemia, unspecified: Secondary | ICD-10-CM | POA: Diagnosis not present

## 2017-03-26 LAB — IRON,TIBC AND FERRITIN PANEL: Ferritin: 15

## 2017-04-05 ENCOUNTER — Encounter (HOSPITAL_COMMUNITY): Payer: Self-pay

## 2017-04-05 ENCOUNTER — Encounter (HOSPITAL_COMMUNITY): Payer: 59 | Attending: Oncology | Admitting: Oncology

## 2017-04-05 ENCOUNTER — Encounter (HOSPITAL_COMMUNITY): Payer: 59

## 2017-04-05 DIAGNOSIS — Z7901 Long term (current) use of anticoagulants: Secondary | ICD-10-CM | POA: Diagnosis not present

## 2017-04-05 DIAGNOSIS — I82432 Acute embolism and thrombosis of left popliteal vein: Secondary | ICD-10-CM

## 2017-04-05 DIAGNOSIS — Z3A12 12 weeks gestation of pregnancy: Secondary | ICD-10-CM

## 2017-04-05 DIAGNOSIS — O2231 Deep phlebothrombosis in pregnancy, first trimester: Secondary | ICD-10-CM

## 2017-04-05 DIAGNOSIS — Z86718 Personal history of other venous thrombosis and embolism: Secondary | ICD-10-CM

## 2017-04-05 DIAGNOSIS — Z86711 Personal history of pulmonary embolism: Secondary | ICD-10-CM

## 2017-04-05 DIAGNOSIS — O2230 Deep phlebothrombosis in pregnancy, unspecified trimester: Secondary | ICD-10-CM

## 2017-04-05 LAB — ANTITHROMBIN III: AntiThromb III Func: 92 % (ref 75–120)

## 2017-04-05 LAB — D-DIMER, QUANTITATIVE: D-Dimer, Quant: 0.93 ug/mL-FEU — ABNORMAL HIGH (ref 0.00–0.50)

## 2017-04-05 NOTE — Progress Notes (Signed)
[facility]  CONSULT NOTE  Patient Care Team: Kari Baars, MD as PCP - General (Internal Medicine)  CHIEF COMPLAINTS/PURPOSE OF CONSULTATION:  DVT  HISTORY OF PRESENTING ILLNESS:  Sydney Holloway 41 y.o. female is here because of referral by Philip Aspen for left leg DVT. The patient reports she is [redacted] weeks pregnant with her 3rd child.   She reports a history of oral contraceptive use, which she discontinued following her first DVT/PE in November 2006. At that time patient had been on a road trip to [place] and when she came back she was having difficulty breathing, she was found to have a pulmonary embolus with clot identified in the descending interlobar vessels bilaterally on CTA chest on 10/16/2005. Doppler venous US of her left lower extremity performed on 06/18/2005 demonstrated a DVT within a posterior tibial vein in the proximal left calf. Patient was initially placed on lovenox with bridge to coumadin, she states she was on anticoagulation for less than a year but cannot remember the exact time. The patient is unsure if she had a hypercoagulable workup at that time.   The patient reports swelling in her left leg which began in February 2018. She had no significant symptoms until April when she could not walk because of pain in her left lower leg.   This time patient states that she has been having intermittent left leg swelling since February 2018. She was on a roadtrip to [place] prior to the development of her symptoms. She had a doppler venous US of her left leg on 01/25/17 which was negative for DVT. Then the patient underwent lower left leg [place] on 02/28/17 because she could not walk and it revealed an acute deep vein thrombosis in the left popliteal vein. No other deep venous thrombosis was identified on the left.   She reports shortness of breath only when walking long distances. She reports some "pressure" in her chest which may be because of her bra or her  pregnancy. She is currently on Lovenox 120 mg once daily, and reports her OB/GYN is concerned about the dosage of this medication. She denies any bruising or bleeding concerns.   The patient reports a family history positive for blood clotting disorders (unknown specifics) and blood clots in her maternal aunt.   MEDICAL HISTORY:  Past Medical History:  Diagnosis Date  . DVT of leg (deep venous thrombosis) (HCC)    right  . Pulmonary embolus (HCC)    bilateral lungs    SURGICAL HISTORY: Past Surgical History:  Procedure Laterality Date  . CESAREAN SECTION    . KNEE SURGERY      SOCIAL HISTORY: Social History   Social History  . Marital status: Married    Spouse name: N/A  . Number of children: N/A  . Years of education: N/A   Occupational History  . Not on file.   Social History Main Topics  . Smoking status: Never Smoker  . Smokeless tobacco: Never Used  . Alcohol use No  . Drug use: No  . Sexual activity: Yes   Other Topics Concern  . Not on file   Social History Narrative  . No narrative on file    FAMILY HISTORY: History reviewed. No pertinent family history.  ALLERGIES:  has No Known Allergies.  MEDICATIONS:  Current Outpatient Prescriptions  Medication Sig Dispense Refill  . acetaminophen (TYLENOL) 325 MG tablet Take 650 mg by mouth every 6 (six) hours as needed.    Marland Kitchen  enoxaparin (LOVENOX) 120 MG/0.8ML injection enoxaparin 120 mg/0.8 mL subcutaneous syringe    . IRON PO Take 1 tablet by mouth daily.    . Prenatal Vit-Fe Fumarate-FA (PRENATAL ONE DAILY PO) Take 1 tablet by mouth daily.     No current facility-administered medications for this visit.     Review of Systems  Constitutional: Negative.  Negative for malaise/fatigue.  HENT: Negative.   Eyes: Negative.   Respiratory: Positive for shortness of breath (Only when walking long distances).   Cardiovascular: Negative.  Negative for chest pain ("Pressure" associated with wearing bra and  pregnancy).  Gastrointestinal: Negative.  Negative for blood in stool and melena.  Genitourinary: Negative.        [redacted] weeks pregnant as of 04/05/17 Denies vaginal bleeding  Musculoskeletal: Negative.   Skin: Negative.   Neurological: Negative.  Negative for weakness.  Endo/Heme/Allergies: Negative.  Does not bruise/bleed easily.  Psychiatric/Behavioral: Negative.   All other systems reviewed and are negative.  14 point ROS was done and is otherwise as detailed above or in HPI   PHYSICAL EXAMINATION:   Vitals:   04/05/17 1315  BP: 122/71  Pulse: 80  Resp: 16  Temp: 98.2 F (36.8 C)   Filed Weights   04/05/17 1315  Weight: 168 lb 3.2 oz (76.3 kg)     Physical Exam  Constitutional: She is oriented to person, place, and time and well-developed, well-nourished, and in no distress.  HENT:  Head: Normocephalic and atraumatic.  Eyes: Conjunctivae and EOM are normal. Pupils are equal, round, and reactive to light.  Neck: Normal range of motion. Neck supple.  Cardiovascular: Normal rate, regular rhythm and normal heart sounds.  Exam reveals no gallop and no friction rub.   No murmur heard. Pulmonary/Chest: Effort normal and breath sounds normal. No respiratory distress. She has no wheezes. She has no rales. She exhibits no tenderness.  Abdominal: Soft. Bowel sounds are normal. She exhibits no distension. There is no tenderness.  Musculoskeletal: Normal range of motion. She exhibits edema (Swelling in left lower extremity, larger than right).  Neurological: She is alert and oriented to person, place, and time. Gait normal.  Skin: Skin is warm and dry.  Nursing note and vitals reviewed.     LABORATORY DATA:  I have reviewed the data as listed Lab Results  Component Value Date   WBC 5.5 02/19/2009   HGB 13.4 02/19/2009   HCT 40.4 02/19/2009   MCV 85.3 02/19/2009   PLT 269 02/19/2009   CMP  No results found for: NA, K, CL, CO2, GLUCOSE, BUN, CREATININE, CALCIUM, PROT,  ALBUMIN, AST, ALT, ALKPHOS, BILITOT, GFRNONAA, GFRAA   RADIOGRAPHIC STUDIES:  ASSESSMENT & PLAN:  41 year old female in her 3rd month of pregnancy with a history of a provoked PE/DVT in 2006 presents for evaluation of left leg popliteal DVT. She is currently on anticoagulation with Lovenox 120 mg [place] daily.  PLAN: Hypercoagulable workup ordered today.  Pregnancy itself is a risk factor for thromboembolism. Patient's DVT could have been provoked from immobility due to road trip to [place]. LMWH is the best option for anticoagulation in pregnancy. We discussed the patient's Lovenox dosage. I have advised her that the correct dosage should be 1mg /kg [place] BID. Ideally it should be about 75 mg [place] BID, however she should be fine on the dose of 120 mg. She currently is having financial constraints and is expressing concern about the cost of her lovenox per month.  I recommend against the use  of oral direct thrombin inhibitors (Pradaxa) or anti-Xa inhibitors (such as eliquis or xarelto) in pregnant women since their effects in pregnancy have not been well studied. Warfarin is contraindicated due to its teratogenic effects. Plan to repeat her Doppler venous US of left leg in July when she would have been on therapy for 3 months.   RTC for follow up in 2-3 weeks to discuss hypercoagulable lab results.    ORDERS PLACED FOR THIS ENCOUNTER: Orders Placed This Encounter  Procedures  . US Venous Img Lower Unilateral Left  . Antithrombin III  . Beta-2-glycoprotein i abs, IgG/M/A  . Cardiolipin antibodies, IgG, IgM, IgA  . Factor 5 leiden  . Homocysteine, serum  . Lupus anticoagulant panel  . Protein C activity  . Protein C, total  . Protein S activity  . Protein S, total  . Prothrombin gene mutation  . D-dimer, quantitative    MEDICATIONS PRESCRIBED THIS ENCOUNTER: Meds ordered this encounter  Medications  . enoxaparin (LOVENOX) 120 MG/0.8ML injection    Sig: enoxaparin 120 mg/0.8 mL  subcutaneous syringe  . acetaminophen (TYLENOL) 325 MG tablet    Sig: Take 650 mg by mouth every 6 (six) hours as needed.  . Prenatal Vit-Fe Fumarate-FA (PRENATAL ONE DAILY PO)    Sig: Take 1 tablet by mouth daily.  . IRON PO    Sig: Take 1 tablet by mouth daily.   All questions were answered. The patient knows to call the clinic with any problems, questions or concerns.  I spent 40 minutes counseling the patient face to face. The total time spent in the appointment was 55 minutes and more than 50% was on counseling and review of test results  This document serves as a record of services personally performed by Ralene CorkLouise Macgregor Aeschliman, MD. It was created on her behalf by Lavenia AtlasSarah Singleton, a trained medical scribe. The creation of this record is based on the scribe's personal observations and the provider's statements to them. This document has been checked and approved by the attending provider.  This note was electronically signed by:  Ralene CorkLouise Nakyia Dau, MD  04/05/2017 1:27 PM

## 2017-04-05 NOTE — Patient Instructions (Signed)
[facility] at [facility] Discharge Instructions  RECOMMENDATIONS MADE BY THE CONSULTANT AND ANY TEST RESULTS WILL BE SENT TO YOUR REFERRING PHYSICIAN.  You were seen today by Dr. Louise Zhou    Thank you for choosing [facility] at [facility] to provide your oncology and hematology care.  To afford each patient quality time with our provider, please arrive at least 15 minutes before your scheduled appointment time.    If you have a lab appointment with the Cancer Center please come in thru the  Main Entrance and check in at the main information desk  You need to re-schedule your appointment should you arrive 10 or more minutes late.  We strive to give you quality time with our providers, and arriving late affects you and other patients whose appointments are after yours.  Also, if you no show three or more times for appointments you may be dismissed from the clinic at the providers discretion.     Again, thank you for choosing [facility].  Our hope is that these requests will decrease the amount of time that you wait before being seen by our physicians.       _____________________________________________________________  Should you have questions after your visit to [facility], please contact our office at [phone] between the hours of 8:30 a.m. and 4:30 p.m.  Voicemails left after 4:30 p.m. will not be returned until the following business day.  For prescription refill requests, have your pharmacy contact our office.       Resources For Cancer Patients and their Caregivers ? American Cancer Society: Can assist with transportation, wigs, general needs, runs Look Good Feel Better.        1-888-227-6333 ? Cancer Care: Provides financial assistance, online support groups, medication/co-pay assistance.  1-800-813-HOPE (4673) ? [facility] Assists [place] cancer patients and their  families through emotional , educational and financial support.  336-427-4357 ? [place] DSS Where to apply for food stamps, Medicaid and utility assistance. 336-342-1394 ? [facility]: Transportation to medical appointments. 336-347-2287 ? Social Security Administration: May apply for disability if have a Stage IV cancer. 336-342-7796 1-800-772-1213 ? [place] Aging, Disability and Transit Services: Assists with nutrition, care and transit needs. 336-349-2343  Cancer Center Support Programs: @[email or web address]@ > Cancer Support Group  2nd Tuesday of the month 1pm-2pm, Journey Room  > Creative Journey  3rd Tuesday of the month 1130am-1pm, Journey Room  > Look Good Feel Better  1st Wednesday of the month 10am-12 noon, Journey Room (Call American Cancer Society to register 1-800-395-5775)    

## 2017-04-08 LAB — CARDIOLIPIN ANTIBODIES, IGG, IGM, IGA
Anticardiolipin IgA: <9 APL U/mL (ref 0–11)
Anticardiolipin IgG: <9 GPL U/mL (ref 0–14)
Anticardiolipin IgM: <9 MPL U/mL (ref 0–12)

## 2017-04-08 LAB — LUPUS ANTICOAGULANT PANEL
DRVVT: 46.8 s (ref 0.0–47.0)
PTT Lupus Anticoagulant: 43.9 s (ref 0.0–51.9)

## 2017-04-08 LAB — BETA-2-GLYCOPROTEIN I ABS, IGG/M/A
Beta-2 Glyco I IgG: <9 GPI IgG units (ref 0–20)
Beta-2-Glycoprotein I IgA: <9 GPI IgA units (ref 0–25)

## 2017-04-08 LAB — PROTEIN C ACTIVITY: PROTEIN C ACTIVITY: 129 % (ref 73–180)

## 2017-04-08 LAB — PROTEIN S ACTIVITY: Protein S Activity: 63 % (ref 63–140)

## 2017-04-08 LAB — HOMOCYSTEINE: HOMOCYSTEINE-NORM: 5.8 umol/L (ref 0.0–15.0)

## 2017-04-08 LAB — PROTEIN S, TOTAL: Protein S Ag, Total: 85 % (ref 60–150)

## 2017-04-09 LAB — PROTEIN C, TOTAL: PROTEIN C, TOTAL: 108 % (ref 60–150)

## 2017-04-09 LAB — FACTOR 5 LEIDEN

## 2017-04-10 LAB — PROTHROMBIN GENE MUTATION

## 2017-04-16 ENCOUNTER — Encounter: Payer: Self-pay | Admitting: Pharmacist

## 2017-04-16 ENCOUNTER — Telehealth (HOSPITAL_BASED_OUTPATIENT_CLINIC_OR_DEPARTMENT_OTHER): Payer: 59 | Admitting: Pharmacist

## 2017-04-16 ENCOUNTER — Other Ambulatory Visit: Payer: Self-pay | Admitting: Pharmacist

## 2017-04-16 DIAGNOSIS — Z79899 Other long term (current) drug therapy: Secondary | ICD-10-CM

## 2017-04-16 MED ORDER — ENOXAPARIN SODIUM 120 MG/0.8ML ~~LOC~~ SOLN: 120.0000 mg | SUBCUTANEOUS | 3 refills | Status: DC

## 2017-04-16 NOTE — Progress Notes (Signed)
   S: Patient presents for a video visit with [facility] for review of their specialty medication therapy.  Patient is currently taking Lovenox for DVT treatment while pregnant. Patient is managed by Dr. Janyth ContesZhou for this.   Adherence: denies any missed doses  Efficacy: still has some swelling in her leg where the DVT is .  Dosing:  Pregnant women (off-label use): 1 mg/kg/dose every 12 hours. Discontinue ?24 hours prior to the induction of labor or cesarean section. Enoxaparin therapy may be substituted with heparin near term. Continue anticoagulation therapy for ?6 weeks postpartum (minimum duration of therapy: 3 months). LMWH or heparin therapy is preferred over warfarin during pregnancy Jenne Pane(Bates 2012). Ideal dose would be 75 mg BID.  Patient is taking 120 mg daily. This dose was reviewed and approved by Hematology/Oncology (see note 04/05/2017)  Drug-drug interactions: none  Monitoring: S/sx of bleeding: denies CBC: completed S/sx of hypersensitivity: denies   O:     Lab Results  Component Value Date   WBC 5.5 02/19/2009   HGB 13.4 02/19/2009   HCT 40.4 02/19/2009   MCV 85.3 02/19/2009   PLT 269 02/19/2009      Chemistry   No results found for: NA, K, CL, CO2, BUN, CREATININE, GLU No results found for: CALCIUM, ALKPHOS, AST, ALT, BILITOT     A/P: 1. Medication review: Patient currently on Lovenox for treatment of DVT during pregnancy. Reviewed the medication with the patient, including the following: Lovenox is an anticoagulants used for the treatment of DVT/PE. It increases the risk of bleeding so monitor for bruising, blood in urine/stool, or injuries that will not stop bleeding. Missing doses increases the risk of a new clot. Lovenox is pregnancy category B, while the other anticoagulants have either not been studied in pregnancy patients or have shown teratogenic effects (warfarin).    Juanita CraverStacey Karl, PharmD, BCPS, BCACP, CPP Clinical Pharmacist  Practitioner  [facility] [phone]

## 2017-04-17 ENCOUNTER — Telehealth: Payer: Self-pay | Admitting: Oncology

## 2017-04-17 NOTE — Telephone Encounter (Signed)
Faxed records to [facility]

## 2017-04-26 ENCOUNTER — Encounter (HOSPITAL_COMMUNITY): Payer: Self-pay | Admitting: Adult Health

## 2017-04-26 ENCOUNTER — Encounter (HOSPITAL_COMMUNITY): Payer: 59 | Attending: Adult Health | Admitting: Oncology

## 2017-04-26 VITALS — BP 118/71 | HR 97 | Temp 98.4°F | Resp 16 | Ht 60.0 in | Wt 171.6 lb

## 2017-04-26 DIAGNOSIS — I82432 Acute embolism and thrombosis of left popliteal vein: Secondary | ICD-10-CM

## 2017-04-26 DIAGNOSIS — O2231 Deep phlebothrombosis in pregnancy, first trimester: Secondary | ICD-10-CM

## 2017-04-26 DIAGNOSIS — Z86711 Personal history of pulmonary embolism: Secondary | ICD-10-CM | POA: Diagnosis not present

## 2017-04-26 DIAGNOSIS — O2230 Deep phlebothrombosis in pregnancy, unspecified trimester: Secondary | ICD-10-CM

## 2017-04-26 DIAGNOSIS — Z7901 Long term (current) use of anticoagulants: Secondary | ICD-10-CM

## 2017-04-26 DIAGNOSIS — Z3480 Encounter for supervision of other normal pregnancy, unspecified trimester: Secondary | ICD-10-CM | POA: Diagnosis not present

## 2017-04-26 DIAGNOSIS — Z3A12 12 weeks gestation of pregnancy: Secondary | ICD-10-CM

## 2017-04-26 NOTE — Progress Notes (Signed)
[facility]  CONSULT NOTE  Patient Care Team: Kari Baars, MD as PCP - General (Internal Medicine)  CHIEF COMPLAINTS/PURPOSE OF CONSULTATION:  DVT  HISTORY OF PRESENTING ILLNESS:  Sydney Holloway 41 y.o. female is here because of referral by Philip Aspen for left leg DVT. The patient reports she is [redacted] weeks pregnant with her 3rd child.   She reports a history of oral contraceptive use, which she discontinued following her first DVT/PE in November 2006. At that time patient had been on a road trip to [place] and when she came back she was having difficulty breathing, she was found to have a pulmonary embolus with clot identified in the descending interlobar vessels bilaterally on CTA chest on 10/16/2005. Doppler venous US of her left lower extremity performed on 06/18/2005 demonstrated a DVT within a posterior tibial vein in the proximal left calf. Patient was initially placed on lovenox with bridge to coumadin, she states she was on anticoagulation for less than a year but cannot remember the exact time. The patient is unsure if she had a hypercoagulable workup at that time.   The patient reports swelling in her left leg which began in February 2018. She had no significant symptoms until April when she could not walk because of pain in her left lower leg.   This time patient states that she has been having intermittent left leg swelling since February 2018. She was on a roadtrip to [place] prior to the development of her symptoms. She had a doppler venous US of her left leg on 01/25/17 which was negative for DVT. Then the patient underwent lower left leg [place] on 02/28/17 because she could not walk and it revealed an acute deep vein thrombosis in the left popliteal vein. No other deep venous thrombosis was identified on the left.   She reports shortness of breath only when walking long distances. She reports some "pressure" in her chest which may be because of her bra or her  pregnancy. She is currently on Lovenox 120 mg once daily, and reports her OB/GYN is concerned about the dosage of this medication. She denies any bruising or bleeding concerns.   The patient reports a family history positive for blood clotting disorders (unknown specifics) and blood clots in her maternal aunt.  INTERVAL HISTORY: Patient presented today for follow up. Her hypercoagulable workup is negative. She continues to take lovenox 120mg  [place] daily without any problems with bleeding. She states her pregnancy is going well and there's no issues with the baby. She has no complaints today.   MEDICAL HISTORY:  Past Medical History:  Diagnosis Date  . DVT of leg (deep venous thrombosis) (HCC)    right  . Pulmonary embolus (HCC)    bilateral lungs    SURGICAL HISTORY: Past Surgical History:  Procedure Laterality Date  . CESAREAN SECTION    . KNEE SURGERY      SOCIAL HISTORY: Social History   Social History  . Marital status: Married    Spouse name: N/A  . Number of children: N/A  . Years of education: N/A   Occupational History  . Not on file.   Social History Main Topics  . Smoking status: Never Smoker  . Smokeless tobacco: Never Used  . Alcohol use No  . Drug use: No  . Sexual activity: Yes   Other Topics Concern  . Not on file   Social History Narrative  . No narrative on file    FAMILY HISTORY:  History reviewed. No pertinent family history.  ALLERGIES:  has No Known Allergies.  MEDICATIONS:  Current Outpatient Prescriptions  Medication Sig Dispense Refill  . acetaminophen (TYLENOL) 325 MG tablet Take 650 mg by mouth every 6 (six) hours as needed.    . enoxaparin (LOVENOX) 120 MG/0.8ML injection Inject 0.8 mLs (120 mg total) into the skin daily. 30 Syringe 3  . IRON PO Take 1 tablet by mouth daily.    . Prenatal Vit-Fe Fumarate-FA (PRENATAL ONE DAILY PO) Take 1 tablet by mouth daily.     No current facility-administered medications for this visit.      Review of Systems  Constitutional: Negative.  Negative for malaise/fatigue.  HENT: Negative.   Eyes: Negative.   Respiratory: Negative for shortness of breath.   Cardiovascular: Negative.  Negative for chest pain ("Pressure" associated with wearing bra and pregnancy).  Gastrointestinal: Negative.  Negative for blood in stool and melena.  Genitourinary: Negative.        [redacted] weeks pregnant as of 04/05/17 Denies vaginal bleeding  Musculoskeletal: Negative.   Skin: Negative.   Neurological: Negative.  Negative for weakness.  Endo/Heme/Allergies: Negative.  Does not bruise/bleed easily.  Psychiatric/Behavioral: Negative.   All other systems reviewed and are negative.  14 point ROS was done and is otherwise as detailed above or in HPI   PHYSICAL EXAMINATION:   Vitals:   04/26/17 1045  BP: 118/71  Pulse: 97  Resp: 16  Temp: 98.4 F (36.9 C)   Filed Weights   04/26/17 1045  Weight: 171 lb 9.6 oz (77.8 kg)     Physical Exam  Constitutional: She is oriented to person, place, and time and well-developed, well-nourished, and in no distress.  HENT:  Head: Normocephalic and atraumatic.  Eyes: Conjunctivae and EOM are normal. Pupils are equal, round, and reactive to light.  Neck: Normal range of motion. Neck supple.  Cardiovascular: Normal rate, regular rhythm and normal heart sounds.  Exam reveals no gallop and no friction rub.   No murmur heard. Pulmonary/Chest: Effort normal and breath sounds normal. No respiratory distress. She has no wheezes. She has no rales. She exhibits no tenderness.  Abdominal: Soft. Bowel sounds are normal. She exhibits no distension. There is no tenderness.  Musculoskeletal: Normal range of motion. She exhibits no edema.  Neurological: She is alert and oriented to person, place, and time. Gait normal.  Skin: Skin is warm and dry.  Nursing note and vitals reviewed.     LABORATORY DATA:  I have reviewed the data as listed Lab Results   Component Value Date   WBC 5.5 02/19/2009   HGB 13.4 02/19/2009   HCT 40.4 02/19/2009   MCV 85.3 02/19/2009   PLT 269 02/19/2009   CMP  No results found for: NA, K, CL, CO2, GLUCOSE, BUN, CREATININE, CALCIUM, PROT, ALBUMIN, AST, ALT, ALKPHOS, BILITOT, GFRNONAA, GFRAA D-dimer, quantitative  Order: [id number]  Status:  Final result Visible to patient:  Yes (MyChart) Next appt:  05/31/2017 at 11:20 AM in Oncology ([facility]) Dx:  DVT (deep vein thrombosis) in pregnan...    Ref Range & Units 3wk ago 26yr ago   D-Dimer, Quant 0.00 - 0.50 ug/mL-FEU 0.93   0.33    AT THE INHOUSE ESTABLIS...R   Comment: (NOTE)  At the manufacturer cut-off of 0.50 ug/mL FEU, this assay has been  documented to exclude PE with a sensitivity and negative predictive  value of 97 to 99%. At this time, this assay has not been approved  by the FDA to exclude DVT/VTE.  Results should be correlated with clinical presentation.   Resulting Agency  [facility]    Specimen Collected: 04/05/17 13:47 Last Resulted: 04/05/17 15:24            R=Reference range differs from displayed range        Other Results from 04/05/2017   Antithrombin III  Order: [id number]   Status:  Final result Visible to patient:  Yes (MyChart) Next appt:  05/31/2017 at 11:20 AM in Oncology ([facility]) Dx:  DVT (deep vein thrombosis) in pregnan...   Ref Range & Units 3wk ago  AntiThromb III Func 75 - 120 % 92   Comment: Performed at [facility], [address] [place], [place] [address]  Resulting Agency  [facility]    Specimen Collected: 04/05/17 13:47 Last Resulted: 04/05/17 22:29                    Beta-2-glycoprotein i abs, IgG/M/A  Order: [id number]   Status:  Final result Visible to patient:  Yes (MyChart) Next appt:  05/31/2017 at 11:20 AM in Oncology ([facility]) Dx:  DVT (deep vein thrombosis) in pregnan...   Ref Range & Units 3wk ago  Beta-2 Glyco I IgG 0 - 20 GPI IgG units <9   Comment:  (NOTE)  The reference interval reflects a 3SD or 99th percentile interval,  which is thought to represent a potentially clinically significant  result in accordance with the International Consensus Statement on  the classification criteria for definitive antiphospholipid  syndrome (APS). J Thromb Haem 2006;4:295-306.   Beta-2-Glycoprotein I IgM 0 - 32 GPI IgM units <9   Comment: (NOTE)  The reference interval reflects a 3SD or 99th percentile interval,  which is thought to represent a potentially clinically significant  result in accordance with the International Consensus Statement on  the classification criteria for definitive antiphospholipid  syndrome (APS). J Thromb Haem 2006;4:295-306.  Performed At: [facility]  [address] [place], [place] [id number]  Mila Homer MD QI:[id number]   Beta-2-Glycoprotein I IgA 0 - 25 GPI IgA units <9   Comment: (NOTE)  The reference interval reflects a 3SD or 99th percentile interval,  which is thought to represent a potentially clinically significant  result in accordance with the International Consensus Statement on  the classification criteria for definitive antiphospholipid  syndrome (APS). J Thromb Haem 2006;4:295-306.   Resulting Agency  [facility]    Specimen Collected: 04/05/17 13:47 Last Resulted: 04/08/17 16:35                    Cardiolipin antibodies, IgG, IgM, IgA  Order: [id number]   Status:  Final result Visible to patient:  Yes (MyChart) Next appt:  05/31/2017 at 11:20 AM in Oncology ([facility]) Dx:  DVT (deep vein thrombosis) in pregnan...   Ref Range & Units 3wk ago  Anticardiolipin IgG 0 - 14 GPL U/mL <9   Comment: (NOTE)              Negative:       <15              Indeterminate:   15 - 20              Low-Med Positive: >20 - 80              High Positive:     >80   Anticardiolipin IgM 0 - 12 MPL U/mL <9  Comment: (NOTE)               Negative:       <13              Indeterminate:   13 - 20              Low-Med Positive: >20 - 80              High Positive:     >80   Anticardiolipin IgA 0 - 11 APL U/mL <9   Comment: (NOTE)              Negative:       <12              Indeterminate:   12 - 20              Low-Med Positive: >20 - 80              High Positive:     >80  Performed At: [facility]  [address] [place], [place] [id number]  Mila Homer MD MV:[id number]   Resulting Agency  [facility]    Specimen Collected: 04/05/17 13:47 Last Resulted: 04/08/17 16:35                    Factor 5 leiden  Order: [id number]   Status:  Final result Visible to patient:  Yes (MyChart) Next appt:  05/31/2017 at 11:20 AM in Oncology ([facility]) Dx:  DVT (deep vein thrombosis) in pregnan...   3wk ago  Recommendations-F5LEID: Comment   Comment: (NOTE)  Result: Negative (no mutation found)  Factor V Leiden is a specific mutation (R506Q) in the factor  V gene that is associated with an increased risk of venous  thrombosis. Factor V Leiden is more resistant to  inactivation by activated protein C. As a result, factor V  persists in the circulation leading to a mild hyper-  coagulable state. The Leiden mutation accounts for 90% -  95% of APC resistance. Factor V Leiden has been reported in  patients with deep vein thrombosis, pulmonary embolus,  central retinal vein occlusion, cerebral sinus thrombosis  and hepatic vein thrombosis. Other risk factors to be  considered in the workup for venous thrombosis include the  G20210A mutation in the factor II (prothrombin) gene,  protein S and C deficiency, and antithrombin deficiencies.  Anticardiolipin antibody and lupus anticoagulant analysis  may be appropriate for certain patients, as well as  homocysteine levels.  Contact  your local [facility] for information on how to order  additional testing if desired.  **Genetic counselors are available for health care providers to**  discuss results at 1-800-345-GENE [phone]).  Methodology:  DNA analysis of the Factor V gene was performed by allele-specific  PCR. The diagnostic sensitivity and specificity is >99% for both.  Molecular-based testing is highly accurate, but as in any  laboratory test, diagnostic errors may occur. All test results must  be combined with clinical information for the most accurate  interpretation.  This test was developed and its performance characteristics  determined by [facility]. It has not been cleared or approved by the  Food and Drug Administration.  References:  Voelkerding K (1996). Clin Lab Med [phone].  Vincenza Hews, PhD, [facility]  Ernestene Mention, PhD, [facility]  Wyatt Portela, M.S., PhD, [facility]  Manya Silvas, PhD, [facility]  Bonnielee Haff, PhD, [facility]  Alpha Gula PhD, [facility]  Performed At: [facility]  [address] [place], [place] [id number]  Oley Balm MD WU:[id number]   Resulting Agency [facility]    Specimen Collected: 04/05/17 13:47 Last Resulted: 04/09/17 18:31                    Homocysteine, serum  Order: [id number]   Status:  Final result Visible to patient:  Yes (MyChart) Next appt:  05/31/2017 at 11:20 AM in Oncology ([facility]) Dx:  DVT (deep vein thrombosis) in pregnan...   Ref Range & Units 3wk ago  Homocysteine 0.0 - 15.0 umol/L 5.8   Comment: (NOTE)  Performed At: [facility]  [address] [place], [place] [id number]  Mila Homer MD [id number]   Resulting Agency  [facility]    Specimen Collected: 04/05/17 13:47 Last Resulted: 04/08/17 14:34                    Lupus anticoagulant panel  Order: [id number]   Status:  Edited Result - FINAL Visible to patient:  Yes (MyChart) Next appt:  05/31/2017 at 11:20 AM in Oncology ([facility]) Dx:  DVT (deep vein  thrombosis) in pregnan...   Ref Range & Units 3wk ago  PTT Lupus Anticoagulant 0.0 - 51.9 sec 43.9   DRVVT 0.0 - 47.0 sec 46.8   Lupus Anticoag Interp  Comment:VC   Comment: (NOTE)  No lupus anticoagulant was detected.  Performed At: [facility]  [address] [place], [place] [id number]  Mila Homer MD GL:[id number]   Resulting Agency  [facility]    Specimen Collected: 04/05/17 13:47 Last Resulted: 04/08/17 16:35            VC=Value has a corrected status           Protein C activity  Order: [id number]   Status:  Final result Visible to patient:  Yes (MyChart) Next appt:  05/31/2017 at 11:20 AM in Oncology ([facility]) Dx:  DVT (deep vein thrombosis) in pregnan...   Ref Range & Units 3wk ago  Protein C Activity 73 - 180 % 129   Comment: (NOTE)  Performed At: [facility]  [address] [place], [place] [id number]  Mila Homer MD AT:[id number]   Resulting Agency  [facility]    Specimen Collected: 04/05/17 13:47 Last Resulted: 04/08/17 16:35                    Protein C, total  Order: [id number]   Status:  Final result Visible to patient:  Yes (MyChart) Next appt:  05/31/2017 at 11:20 AM in Oncology ([facility]) Dx:  DVT (deep vein thrombosis) in pregnan...   Ref Range & Units 3wk ago  Protein C, Total 60 - 150 % 108   Comment: (NOTE)  Performed At: [facility]  [address] [place], [place] [id number]  Mila Homer MD WV:[id number]   Resulting Agency  [facility]    Specimen Collected: 04/05/17 13:47 Last Resulted: 04/09/17 06:34                    Protein S activity  Order: [id number]   Status:  Final result Visible to patient:  Yes (MyChart) Next appt:  05/31/2017 at 11:20 AM in Oncology ([facility]) Dx:  DVT (deep vein thrombosis) in pregnan...   Ref Range & Units 3wk ago  Protein S Activity 63 - 140 % 63   Comment: (NOTE)  A deficiency of protein S (PS), either congenital or acquired,  increases the  risk of thromboembolism. PS activity levels may be  falsely low in individuals with APCR/Factor V Leiden. Consider  performing free protein S antigen in those with APCR/Factor V  Leiden before making a diagnosis of protein S deficiency. Acquired PS  deficiency is more common than congenital deficiency. PS values  decrease with normal pregnancy, and are also dependent on age, sex  and hormone status. PS values tend to be lower in a younger age  group and lower in women than in men. Levels may be decreased in  pre-menopausal women on oral contraceptive agents. Acquired  deficiency can occur as a result of vitamin K deficiency or  antagonism, severe hepatic disorders, (hepatitis, cirrhosis, etc.),  nephrotic syndrome, inflammatory bowel disease, certain  chemotherapeutic agents, L-asparaginse therapy, sepsis, disseminated  intravascular coagulation (DIC) and acute thrombosis. Levels may be  decreased in patients with polycythemia vera, sickle cell disease and  essential thrombocythemia. Repeat evaluation on a new plasma sample  to confirm or refute this result should be considered, after ruling  out acquired causes, depending on the clinical scenario.  Performed At: [facility]  [address] [place], [place] [id number]  Mila Homer MD WU:[id number]   Resulting Agency  [facility]    Specimen Collected: 04/05/17 13:47 Last Resulted: 04/08/17 16:35                    Protein S, total  Order: [id number]   Status:  Final result Visible to patient:  Yes (MyChart) Next appt:  05/31/2017 at 11:20 AM in Oncology ([facility]) Dx:  DVT (deep vein thrombosis) in pregnan...   Ref Range & Units 3wk ago  Protein S Ag, Total 60 - 150 % 85   Comment: (NOTE)  This test was developed and its performance characteristics  determined by [facility]. It has not been cleared or approved  by the Food and Drug Administration.  Performed At: [facility]  [address] [place],  [place] [id number]  Mila Homer MD BM:[id number]   Resulting Agency  [facility]    Specimen Collected: 04/05/17 13:47 Last Resulted: 04/08/17 16:35                    Prothrombin gene mutation  Order: [id number]   Status:  Final result Visible to patient:  Yes (MyChart) Next appt:  05/31/2017 at 11:20 AM in Oncology ([facility]) Dx:  DVT (deep vein thrombosis) in pregnan...   3wk ago  Recommendations-PTGENE: Comment   Comment: (NOTE)  NEGATIVE  No mutation identified.  Comment:  A point mutation (G20210A) in the factor II (prothrombin) gene is  the second most common cause of inherited thrombophilia. The  incidence of this mutation in the [place] Caucasian population is about  2% and in the [place] American population it is approximately 0.5%.  This mutation is rare in the [place] and Native American population.  Being heterozygous for a prothrombin mutation increases the risk for  developing venous thrombosis about 2 to 3 times above the general  population risk. Being homozygous for the prothrombin gene mutation  increases the relative risk for venous thrombosis further, although  it is not yet known how much further the risk is increased. In women  heterozygous for the prothrombin gene mutation, the use of estrogen  containing oral contraceptives increases the relative risk of venous  thrombosis about 16 times and the risk of developing cerebral  thrombosis is also significantly increased. In pregnancy the  prothrombin gene mutation increases  risk for venous thrombosis and  may increase risk for stillbirth, placental abruption, pre-eclampsia  and fetal growth restriction. If the patient possesses two or more  congenital or acquired thrombophilic risk factors, the risk for  thrombosis may rise to more than the sum of the risk ratios for the  individual mutations. This assay detects only the prothrombin G20210A  mutation and does not measure genetic abnormalities elsewhere in the   genome. Other thrombotic risk factors may be pursued through  systematic clinical laboratory analysis. These factors include the  R506Q (Leiden) mutation in the Factor V gene, plasma homocysteine  levels, as well as testing for deficiencies of antithrombin III,  protein C and protein S.  Genetic Counselors are available for health care providers  to discuss results at [phone]-GENE [phone]  Methodology:  DNA analysis of the Factor II gene was performed by PCR  amplification followed by restriction analysis. The  diagnostic sensitivity is >99% for both. All the tests must  be combined with clinical information for the most accurate  interpretation. Molecular-based testing is highly accurate,  but as in any laboratory test, diagnostic errors may occur.  This test was developed and its performance characteristics  determined by [facility]. It has not been cleared or approved  by the Food and Drug Administration.  Poort SR, et al. Blood. 1996; 19:1478-2956; 88:3698-3703.  Varga EA. Circulation. 2004; 110:e15-e18.  Marland KitchenMartinelli I, et al. Arterioscler Thromb Vasc Biol. [phone];  19:700-703.  Vincenza Hewshevonne Eversley, PhD, [facility]  Ernestene MentionMelissa A Hayden, PhD, [facility]  Wyatt PortelaAnnette K Taylor, M.S., PhD, [facility]  Manya SilvasAlecia Willis, PhD, [facility]  Bonnielee HaffHongli Zhan, PhD, [facility]  Alpha GulaJoseph B Kearney, PhD, [facility]  Performed At: [facility]  [address] [place], [place] [id number]  Oley Balmhatterjee Arundhati MD KG:[id number]:[phone]   Resulting Agency [facility]    Specimen Collected: 04/05/17 13:47 Last Resulted: 04/10/17 16:36             RADIOGRAPHIC STUDIES:  ASSESSMENT & PLAN:  41 year old female in her 3rd month of pregnancy with a history of a provoked PE/DVT in 2006 presents for evaluation of left leg popliteal DVT. Patient's DVT could have been provoked from immobility due to road trip to [place]. She is currently on anticoagulation with Lovenox 120 mg [place] daily.  PLAN: Hypercoagulable workup reviewed with the patient; it was negative.  Plan to  continue anticoagulation with lovenox for a total of 3 months. Repeat Doppler venous US of left leg already scheduled on May 31, 2017. At that point she would have been on therapy for 3 months. If DVT has resolved patient can go off of the lovenox, however if the DVT is still present, then plan to anticoagulate for another 3 months.  RTC for follow up in July once she completes her US.    ORDERS PLACED FOR THIS ENCOUNTER: Orders Placed This Encounter  Procedures  . CBC with Differential  . Comprehensive metabolic panel  . D-dimer, quantitative   All questions were answered. The patient knows to call the clinic with any problems, questions or concerns.  I spent 40 minutes counseling the patient face to face. The total time spent in the appointment was 55 minutes and more than 50% was on counseling and review of test results  This document serves as a record of services personally performed by Ralene CorkLouise Adalid Beckmann, MD. It was created on her behalf by Lavenia AtlasSarah Singleton, a trained medical scribe. The creation of this record is based on the scribe's personal observations and the provider's  statements to them. This document has been checked and approved by the attending provider.  This note was electronically signed by:  Ralene Cork, MD  04/26/2017 1:04 PM

## 2017-04-26 NOTE — Patient Instructions (Addendum)
[facility] at [facility] Discharge Instructions  RECOMMENDATIONS MADE BY THE CONSULTANT AND ANY TEST RESULTS WILL BE SENT TO YOUR REFERRING PHYSICIAN.  You were seen today by Dr. Ralene CorkLouise Zhou You will have doppler of left leg on 7/6  Lab work on 7/6 Follow up a few days after those tests to review results.    Thank you for choosing [facility] at [facility] to provide your oncology and hematology care.  To afford each patient quality time with our provider, please arrive at least 15 minutes before your scheduled appointment time.    If you have a lab appointment with the Cancer Center please come in thru the  Main Entrance and check in at the main information desk  You need to re-schedule your appointment should you arrive 10 or more minutes late.  We strive to give you quality time with our providers, and arriving late affects you and other patients whose appointments are after yours.  Also, if you no show three or more times for appointments you may be dismissed from the clinic at the providers discretion.     Again, thank you for choosing [facility].  Our hope is that these requests will decrease the amount of time that you wait before being seen by our physicians.       _____________________________________________________________  Should you have questions after your visit to [facility], please contact our office at [phone] between the hours of 8:30 a.m. and 4:30 p.m.  Voicemails left after 4:30 p.m. will not be returned until the following business day.  For prescription refill requests, have your pharmacy contact our office.       Resources For Cancer Patients and their Caregivers ? American Cancer Society: Can assist with transportation, wigs, general needs, runs Look Good Feel Better.        ([phone] [phone] ? Cancer Care: Provides financial assistance, online support groups, medication/co-pay  assistance.  1-800-813-HOPE (640)529-3755(4673) ? [facility] Assists [place] cancer patients and their families through emotional , educational and financial support.  8011830154717 [phone] ? [place] DSS Where to apply for food stamps, Medicaid and utility assistance. 267-255-83193138721542 ? [facility]: Transportation to medical appointments. (603) 331-86645598688536 ? Social Security Administration: May apply for disability if have a Stage IV cancer. 2233353462571-008-7767 [phone] ? [place] [employer] [employer] [facility] [employer] [facility]: Assists with nutrition, care and transit needs. (706) 676-1877905-005-5848  Cancer Center Support Programs: @10RELATIVEDAYS @ > Cancer Support Group  2nd Tuesday of the month 1pm-2pm, Journey Room  > Creative Journey  3rd Tuesday of the month 1130am-1pm, Journey Room  > Look Good Feel Better  1st Wednesday of the month 10am-12 noon, Journey Room (Call American Cancer Society to register [phone])

## 2017-04-30 ENCOUNTER — Encounter (HOSPITAL_COMMUNITY): Payer: Self-pay | Admitting: *Deleted

## 2017-05-02 ENCOUNTER — Encounter (HOSPITAL_COMMUNITY): Payer: Self-pay

## 2017-05-02 ENCOUNTER — Ambulatory Visit (HOSPITAL_COMMUNITY)
Admission: RE | Admit: 2017-05-02 | Discharge: 2017-05-02 | Disposition: A | Discharge status: Home / Self Care | Payer: 59 | Source: Ambulatory Visit | Attending: Obstetrics and Gynecology | Admitting: Obstetrics and Gynecology

## 2017-05-02 DIAGNOSIS — G43909 Migraine, unspecified, not intractable, without status migrainosus: Secondary | ICD-10-CM | POA: Insufficient documentation

## 2017-05-02 DIAGNOSIS — O34219 Maternal care for unspecified type scar from previous cesarean delivery: Secondary | ICD-10-CM | POA: Insufficient documentation

## 2017-05-02 DIAGNOSIS — Z7901 Long term (current) use of anticoagulants: Secondary | ICD-10-CM | POA: Insufficient documentation

## 2017-05-02 DIAGNOSIS — O99352 Diseases of the nervous system complicating pregnancy, second trimester: Secondary | ICD-10-CM | POA: Insufficient documentation

## 2017-05-02 DIAGNOSIS — Z86711 Personal history of pulmonary embolism: Secondary | ICD-10-CM | POA: Insufficient documentation

## 2017-05-02 DIAGNOSIS — O09522 Supervision of elderly multigravida, second trimester: Secondary | ICD-10-CM | POA: Diagnosis not present

## 2017-05-02 DIAGNOSIS — O09891 Supervision of other high risk pregnancies, first trimester: Secondary | ICD-10-CM | POA: Insufficient documentation

## 2017-05-02 DIAGNOSIS — I82409 Acute embolism and thrombosis of unspecified deep veins of unspecified lower extremity: Secondary | ICD-10-CM | POA: Diagnosis not present

## 2017-05-02 DIAGNOSIS — Z86718 Personal history of other venous thrombosis and embolism: Secondary | ICD-10-CM | POA: Diagnosis not present

## 2017-05-02 DIAGNOSIS — O2230 Deep phlebothrombosis in pregnancy, unspecified trimester: Secondary | ICD-10-CM

## 2017-05-02 DIAGNOSIS — Z3A17 17 weeks gestation of pregnancy: Secondary | ICD-10-CM | POA: Insufficient documentation

## 2017-05-02 DIAGNOSIS — O2232 Deep phlebothrombosis in pregnancy, second trimester: Secondary | ICD-10-CM | POA: Diagnosis not present

## 2017-05-02 NOTE — Progress Notes (Signed)
Maternal Fetal Medicine Consultation  Requesting Provider(s): Claiborne Billings  Primary OB: Claiborne Billings Reason for consultation: DVT in pregnancy  HPI: the patient is a 41yo P2002 at 17+6 weeks. She was referred due to her history of DVT in the first few weeks of her pregnancy. She has a history of DVT and pulmonary embolism in 2005-2006 that was associated with oral contraceptives and a period of immobility. This recent DVT was associated with a period of immobility as well. She sees Dr. Janyth Contes for management and she did a complete thrombophilia workup which was negative in all respects. She is now on 120mg  of Lovenox SQ daily. Her pregnancy is additionally complicated by AMA,and migraines, that has led to a first trimester exposure to topiramate which she took for migraine prophylaxis OB History: OB History    Gravida Para Term Preterm AB Living   3 2 1 1   2    SAB TAB Ectopic Multiple Live Births                Both deliveries were by C/S, the first at 40+ weeks for failure to progress, the second at 37 weeks for previa  PMH:  Past Medical History:  Diagnosis Date  . DVT of leg (deep venous thrombosis) (HCC)    right  . Pulmonary embolus (HCC)    bilateral lungs    PSH:  Past Surgical History:  Procedure Laterality Date  . CESAREAN SECTION    . KNEE SURGERY     Meds: PNV, supplemental iron, Lovenox 120mg /day Allergies: NKDA FH: There is a poorly-defined thromboembolism history in a maternal aunt Soc: Denies tobacco, alcohol and illicit drug use  Review of Systems: no vaginal bleeding or cramping/contractions, no LOF, no nausea/vomiting. All other systems reviewed and are negative.  PE: See EPIC section for vitals and exam  A/P: 1. AMA: the patient has a low risk NIPT. I discussed the implications of the test results. She is not interested in pursuing further testing at this time. Because she is over 19 years of age, she is at risk for late stillbirth, and antepartum testing from  34-36 weeks until delivery is recommended 2. Migraines/topiramate exposure: she had about one migraine every 3 months while on topiramate, and now has one weekly. She has been off the topiramate since about 7-8 weeks. Exposure during organogenesis was probable, but minimal. She bears a slightly increased risk for structural anomalies and would benefit from a detailed evaluation of anatomy. We would be glad to do that between 18-20 weeks if you so desire. I have told her she may restart her topiramate for migraine prophylaxis as she is now well outside the period of organogenesis. 3. Venous thromboembolism: the patient states that there has been some disagreement over the length of her Lovenox treatment. She states that Dr. Janyth Contes has told her she could discontinue the Lovenox if she has a clear LE doppler in July. She also states that you want her to continue on the Lovenox throughout the pregnancy. I let her know that she needs to remain on her therapeutic dose of Lovenox through the pregnancy and then on through the postpartum period for 6 weeks. It is unclear as to whether she can switch to a prophylactic dose (40mg ) in the postpartum period. Dr. Juanda Chance opinion can be sought about that change in dosing. Personally I feel that the patient might have misunderstood Dr. Janyth Contes; she might be discussing whether she needs ongoing oral anticoagulation after the pregnancy or not, rather than cessation  of Lovenox during the pregnancy  Thank you for the opportunity to be a part of the care of Sydney Holloway. Please contact our office if we can be of further assistance.   I spent approximately 30 minutes with this patient with over 50% of time spent in face-to-face counseling.

## 2017-05-02 NOTE — Addendum Note (Signed)
Encounter addended by: Allegra LaiNewman, Mark Glidwell, MD on: 05/02/2017  2:01 PM<BR>    Actions taken: Visit diagnoses modified, Problem List modified, Sign clinical note, LOS modified, Follow-up modified

## 2017-05-02 NOTE — Addendum Note (Signed)
Encounter addended by: Reita ChardPaschal, Sharnae Winfree R, RN on: 05/02/2017  1:05 PM<BR>    Actions taken: Charge Capture section accepted

## 2017-05-10 NOTE — Video Visit (Signed)
Session Summary Report for Virtual Visit using the MDLIVE platform, occurred on Date [id number] with Provider [facility] event:join, time:18:00, mrn: [phone] event:join, time:18:02, Health Sys: [id number] event:leave, time:18:02, Health Sys: 42016 event:join, time:18:02, Health Sys: 42016 event:leave, time:18:03, Health Sys: 42016 event:join, time:18:03, Health Sys: 42016 event:leave, time:18:03, Health Sys: 42016 event:join, time:18:03, Health Sys: 42016 event:end, time:18:03, Health Sys: [phone]

## 2017-05-28 DIAGNOSIS — Z363 Encounter for antenatal screening for malformations: Secondary | ICD-10-CM | POA: Diagnosis not present

## 2017-05-31 ENCOUNTER — Encounter (HOSPITAL_COMMUNITY): Payer: 59 | Attending: Adult Health

## 2017-05-31 ENCOUNTER — Ambulatory Visit (HOSPITAL_COMMUNITY)
Admission: RE | Admit: 2017-05-31 | Discharge: 2017-05-31 | Disposition: A | Discharge status: Home / Self Care | Payer: 59 | Source: Ambulatory Visit | Attending: Oncology | Admitting: Oncology

## 2017-05-31 DIAGNOSIS — Z3A00 Weeks of gestation of pregnancy not specified: Secondary | ICD-10-CM | POA: Insufficient documentation

## 2017-05-31 DIAGNOSIS — O2230 Deep phlebothrombosis in pregnancy, unspecified trimester: Secondary | ICD-10-CM | POA: Diagnosis not present

## 2017-05-31 DIAGNOSIS — O2231 Deep phlebothrombosis in pregnancy, first trimester: Secondary | ICD-10-CM | POA: Insufficient documentation

## 2017-05-31 DIAGNOSIS — I82409 Acute embolism and thrombosis of unspecified deep veins of unspecified lower extremity: Secondary | ICD-10-CM | POA: Diagnosis present

## 2017-05-31 DIAGNOSIS — Z86718 Personal history of other venous thrombosis and embolism: Secondary | ICD-10-CM | POA: Diagnosis not present

## 2017-05-31 DIAGNOSIS — Z7901 Long term (current) use of anticoagulants: Secondary | ICD-10-CM | POA: Diagnosis not present

## 2017-05-31 DIAGNOSIS — I82432 Acute embolism and thrombosis of left popliteal vein: Secondary | ICD-10-CM | POA: Diagnosis not present

## 2017-05-31 LAB — COMPREHENSIVE METABOLIC PANEL
ALBUMIN: 3 g/dL — AB (ref 3.5–5.0)
ALK PHOS: 48 U/L (ref 38–126)
ALT: 17 U/L (ref 14–54)
ANION GAP: 8 (ref 5–15)
AST: 20 U/L (ref 15–41)
BILIRUBIN TOTAL: 0.3 mg/dL (ref 0.3–1.2)
BUN: 6 mg/dL (ref 6–20)
CALCIUM: 8.9 mg/dL (ref 8.9–10.3)
CO2: 22 mmol/L (ref 22–32)
Chloride: 103 mmol/L (ref 101–111)
Creatinine, Ser: 0.59 mg/dL (ref 0.44–1.00)
GFR calc Af Amer: >60 mL/min (ref >60)
GFR calc non Af Amer: >60 mL/min (ref >60)
GLUCOSE: 101 mg/dL — AB (ref 65–99)
POTASSIUM: 3.8 mmol/L (ref 3.5–5.1)
Sodium: 133 mmol/L — ABNORMAL LOW (ref 135–145)
TOTAL PROTEIN: 6.7 g/dL (ref 6.5–8.1)

## 2017-05-31 LAB — CBC WITH DIFFERENTIAL/PLATELET
BASOS PCT: 0 %
Basophils Absolute: 0 10*3/uL (ref 0.0–0.1)
Eosinophils Absolute: 0.2 10*3/uL (ref 0.0–0.7)
Eosinophils Relative: 2 %
HEMATOCRIT: 32.6 % — AB (ref 36.0–46.0)
HEMOGLOBIN: 10.7 g/dL — AB (ref 12.0–15.0)
LYMPHS ABS: 2.2 10*3/uL (ref 0.7–4.0)
Lymphocytes Relative: 24 %
MCH: 26.6 pg (ref 26.0–34.0)
MCHC: 32.8 g/dL (ref 30.0–36.0)
MCV: 80.9 fL (ref 78.0–100.0)
MONO ABS: 0.7 10*3/uL (ref 0.1–1.0)
MONOS PCT: 8 %
NEUTROS ABS: 6 10*3/uL (ref 1.7–7.7)
NEUTROS PCT: 66 %
Platelets: 196 10*3/uL (ref 150–400)
RBC: 4.03 MIL/uL (ref 3.87–5.11)
RDW: 19 % — ABNORMAL HIGH (ref 11.5–15.5)
WBC: 9 10*3/uL (ref 4.0–10.5)

## 2017-05-31 LAB — D-DIMER, QUANTITATIVE (NOT AT ARMC): D DIMER QUANT: 0.95 ug{FEU}/mL — AB (ref 0.00–0.50)

## 2017-06-03 ENCOUNTER — Encounter (HOSPITAL_BASED_OUTPATIENT_CLINIC_OR_DEPARTMENT_OTHER): Payer: 59 | Admitting: Oncology

## 2017-06-03 ENCOUNTER — Encounter (HOSPITAL_COMMUNITY): Payer: Self-pay | Admitting: Oncology

## 2017-06-03 VITALS — BP 123/73 | HR 98 | Resp 18 | Ht 60.0 in | Wt 188.0 lb

## 2017-06-03 DIAGNOSIS — Z86718 Personal history of other venous thrombosis and embolism: Secondary | ICD-10-CM

## 2017-06-03 DIAGNOSIS — O2231 Deep phlebothrombosis in pregnancy, first trimester: Secondary | ICD-10-CM | POA: Diagnosis not present

## 2017-06-03 DIAGNOSIS — Z3A12 12 weeks gestation of pregnancy: Secondary | ICD-10-CM | POA: Diagnosis not present

## 2017-06-03 DIAGNOSIS — I82432 Acute embolism and thrombosis of left popliteal vein: Secondary | ICD-10-CM

## 2017-06-03 DIAGNOSIS — Z7901 Long term (current) use of anticoagulants: Secondary | ICD-10-CM

## 2017-06-03 DIAGNOSIS — Z86711 Personal history of pulmonary embolism: Secondary | ICD-10-CM | POA: Diagnosis not present

## 2017-06-03 DIAGNOSIS — O2230 Deep phlebothrombosis in pregnancy, unspecified trimester: Secondary | ICD-10-CM

## 2017-06-03 NOTE — Progress Notes (Signed)
[facility]  CONSULT NOTE  Patient Care Team: Kari Baars, MD as PCP - General (Internal Medicine)  CHIEF COMPLAINTS/PURPOSE OF CONSULTATION:  DVT  HISTORY OF PRESENTING ILLNESS:  Sydney Holloway 41 y.o. female is here because of referral by Philip Aspen for left leg DVT. The patient reports she is [redacted] weeks pregnant with her 3rd child.   She reports a history of oral contraceptive use, which she discontinued following her first DVT/PE in November 2006. At that time patient had been on a road trip to [place] and when she came back she was having difficulty breathing, she was found to have a pulmonary embolus with clot identified in the descending interlobar vessels bilaterally on CTA chest on 10/16/2005. Doppler venous US of her left lower extremity performed on 06/18/2005 demonstrated a DVT within a posterior tibial vein in the proximal left calf. Patient was initially placed on lovenox with bridge to coumadin, she states she was on anticoagulation for less than a year but cannot remember the exact time. The patient is unsure if she had a hypercoagulable workup at that time.   The patient reports swelling in her left leg which began in February 2018. She had no significant symptoms until April when she could not walk because of pain in her left lower leg.   This time patient states that she has been having intermittent left leg swelling since February 2018. She was on a roadtrip to [place] prior to the development of her symptoms. She had a doppler venous US of her left leg on 01/25/17 which was negative for DVT. Then the patient underwent lower left leg [place] on 02/28/17 because she could not walk and it revealed an acute deep vein thrombosis in the left popliteal vein. No other deep venous thrombosis was identified on the left.   She reports shortness of breath only when walking long distances. She reports some "pressure" in her chest which may be because of her bra or her  pregnancy. She is currently on Lovenox 120 mg once daily, and reports her OB/GYN is concerned about the dosage of this medication. She denies any bruising or bleeding concerns.   The patient reports a family history positive for blood clotting disorders (unknown specifics) and blood clots in her maternal aunt.  Her hypercoagulable workup is negative.  INTERVAL HISTORY: Patient presented today for follow up.  She continues to take lovenox 120mg  [place] daily without any problems with bleeding. She states her pregnancy is going well and there's no issues with the baby. She has no complaints today other than swelling in both her feet. Patient had a repeat doppler venous US of the LLE on 05/31/17 which demonstrated resolution of previous left popliteal DVT.  MEDICAL HISTORY:  Past Medical History:  Diagnosis Date  . DVT of leg (deep venous thrombosis) (HCC)    right  . Pulmonary embolus (HCC)    bilateral lungs    SURGICAL HISTORY: Past Surgical History:  Procedure Laterality Date  . CESAREAN SECTION    . KNEE SURGERY      SOCIAL HISTORY: Social History   Social History  . Marital status: Legally Separated    Spouse name: N/A  . Number of children: N/A  . Years of education: N/A   Occupational History  . Not on file.   Social History Main Topics  . Smoking status: Never Smoker  . Smokeless tobacco: Never Used  . Alcohol use No  . Drug use: No  .  Sexual activity: Yes    Birth control/ protection: None   Other Topics Concern  . Not on file   Social History Narrative  . No narrative on file    FAMILY HISTORY: History reviewed. No pertinent family history.  ALLERGIES:  has No Known Allergies.  MEDICATIONS:  Current Outpatient Prescriptions  Medication Sig Dispense Refill  . acetaminophen (TYLENOL) 325 MG tablet Take 650 mg by mouth every 6 (six) hours as needed.    . clotrimazole-betamethasone (LOTRISONE) cream clotrimazole-betamethasone 1 %-0.05 % topical cream    .  enoxaparin (LOVENOX) 120 MG/0.8ML injection Inject 0.8 mLs (120 mg total) into the skin daily. 30 Syringe 3  . IRON PO Take 1 tablet by mouth daily.    . Prenatal Vit-Fe Fumarate-FA (PRENATAL ONE DAILY PO) Take 1 tablet by mouth daily.     No current facility-administered medications for this visit.     Review of Systems  Constitutional: Negative.  Negative for malaise/fatigue.  HENT: Negative.   Eyes: Negative.   Respiratory: Negative for shortness of breath.   Cardiovascular: Negative.  Negative for chest pain and palpitations.  Gastrointestinal: Negative.  Negative for blood in stool and melena.  Genitourinary: Negative.   Musculoskeletal: Negative.   Skin: Negative.   Neurological: Negative.  Negative for weakness.  Endo/Heme/Allergies: Negative.  Does not bruise/bleed easily.  Psychiatric/Behavioral: Negative.   All other systems reviewed and are negative.  14 point ROS was done and is otherwise as detailed above or in HPI   PHYSICAL EXAMINATION:   Vitals:   06/03/17 1459  BP: 123/73  Pulse: 98  Resp: 18   Filed Weights   06/03/17 1459  Weight: 188 lb (85.3 kg)     Physical Exam  Constitutional: She is oriented to person, place, and time and well-developed, well-nourished, and in no distress.  HENT:  Head: Normocephalic and atraumatic.  Eyes: Conjunctivae and EOM are normal. Pupils are equal, round, and reactive to light.  Neck: Normal range of motion. Neck supple.  Cardiovascular: Normal rate, regular rhythm and normal heart sounds.  Exam reveals no gallop and no friction rub.   No murmur heard. Pulmonary/Chest: Effort normal and breath sounds normal. No respiratory distress. She has no wheezes. She has no rales. She exhibits no tenderness.  Abdominal: Soft. Bowel sounds are normal. She exhibits no distension. There is no tenderness.  Gravid abdomen  Musculoskeletal: Normal range of motion. She exhibits edema (2+ bilateral edema).  Neurological: She is alert  and oriented to person, place, and time. Gait normal.  Skin: Skin is warm and dry.  Nursing note and vitals reviewed.     LABORATORY DATA:  I have reviewed the data as listed Lab Results  Component Value Date   WBC 9.0 05/31/2017   HGB 10.7 (L) 05/31/2017   HCT 32.6 (L) 05/31/2017   MCV 80.9 05/31/2017   PLT 196 05/31/2017   CMP     Component Value Date/Time   NA 133 (L) 05/31/2017 1355   K 3.8 05/31/2017 1355   CL 103 05/31/2017 1355   CO2 22 05/31/2017 1355   GLUCOSE 101 (H) 05/31/2017 1355   BUN 6 05/31/2017 1355   CREATININE 0.59 05/31/2017 1355   CALCIUM 8.9 05/31/2017 1355   PROT 6.7 05/31/2017 1355   ALBUMIN 3.0 (L) 05/31/2017 1355   AST 20 05/31/2017 1355   ALT 17 05/31/2017 1355   ALKPHOS 48 05/31/2017 1355   BILITOT 0.3 05/31/2017 1355   GFRNONAA >60 05/31/2017 1355  GFRAA >60 05/31/2017 1355   D-dimer, quantitative  Order: [id number]  Status:  Final result Visible to patient:  Yes (MyChart) Next appt:  05/31/2017 at 11:20 AM in Oncology ([facility]) Dx:  DVT (deep vein thrombosis) in pregnan...    Ref Range & Units 3wk ago 59yr ago   D-Dimer, Quant 0.00 - 0.50 ug/mL-FEU 0.93   0.33    AT THE INHOUSE ESTABLIS...R   Comment: (NOTE)  At the manufacturer cut-off of 0.50 ug/mL FEU, this assay has been  documented to exclude PE with a sensitivity and negative predictive  value of 97 to 99%. At this time, this assay has not been approved  by the FDA to exclude DVT/VTE.  Results should be correlated with clinical presentation.   Resulting Agency  [facility]    Specimen Collected: 04/05/17 13:47 Last Resulted: 04/05/17 15:24            R=Reference range differs from displayed range        Other Results from 04/05/2017   Antithrombin III  Order: [id number]   Status:  Final result Visible to patient:  Yes (MyChart) Next appt:  05/31/2017 at 11:20 AM in Oncology ([facility]) Dx:  DVT (deep vein thrombosis) in pregnan...   Ref Range &  Units 3wk ago  AntiThromb III Func 75 - 120 % 92   Comment: Performed at [facility], [address] [place], [place] [address]  Resulting Agency  [facility]    Specimen Collected: 04/05/17 13:47 Last Resulted: 04/05/17 22:29                    Beta-2-glycoprotein i abs, IgG/M/A  Order: [id number]   Status:  Final result Visible to patient:  Yes (MyChart) Next appt:  05/31/2017 at 11:20 AM in Oncology ([facility]) Dx:  DVT (deep vein thrombosis) in pregnan...   Ref Range & Units 3wk ago  Beta-2 Glyco I IgG 0 - 20 GPI IgG units <9   Comment: (NOTE)  The reference interval reflects a 3SD or 99th percentile interval,  which is thought to represent a potentially clinically significant  result in accordance with the International Consensus Statement on  the classification criteria for definitive antiphospholipid  syndrome (APS). J Thromb Haem 2006;4:295-306.   Beta-2-Glycoprotein I IgM 0 - 32 GPI IgM units <9   Comment: (NOTE)  The reference interval reflects a 3SD or 99th percentile interval,  which is thought to represent a potentially clinically significant  result in accordance with the International Consensus Statement on  the classification criteria for definitive antiphospholipid  syndrome (APS). J Thromb Haem 2006;4:295-306.  Performed At: [facility]  [address] [place], [place] [id number]  Mila Homer MD WU:[id number]   Beta-2-Glycoprotein I IgA 0 - 25 GPI IgA units <9   Comment: (NOTE)  The reference interval reflects a 3SD or 99th percentile interval,  which is thought to represent a potentially clinically significant  result in accordance with the International Consensus Statement on  the classification criteria for definitive antiphospholipid  syndrome (APS). J Thromb Haem 2006;4:295-306.   Resulting Agency  [facility]    Specimen Collected: 04/05/17 13:47 Last Resulted: 04/08/17 16:35                    Cardiolipin antibodies, IgG,  IgM, IgA  Order: [id number]   Status:  Final result Visible to patient:  Yes (MyChart) Next appt:  05/31/2017 at 11:20 AM in Oncology ([facility]) Dx:  DVT (deep vein thrombosis) in pregnan...   Ref Range & Units 3wk ago  Anticardiolipin IgG 0 - 14 GPL U/mL <9   Comment: (NOTE)              Negative:       <15              Indeterminate:   15 - 20              Low-Med Positive: >20 - 80              High Positive:     >80   Anticardiolipin IgM 0 - 12 MPL U/mL <9   Comment: (NOTE)              Negative:       <13              Indeterminate:   13 - 20              Low-Med Positive: >20 - 80              High Positive:     >80   Anticardiolipin IgA 0 - 11 APL U/mL <9   Comment: (NOTE)              Negative:       <12              Indeterminate:   12 - 20              Low-Med Positive: >20 - 80              High Positive:     >80  Performed At: [facility]  [address] [place], [place] [id number]  Mila HomerHancock William F MD YQ:[id number]:[phone]   Resulting Agency  [facility]    Specimen Collected: 04/05/17 13:47 Last Resulted: 04/08/17 16:35                    Factor 5 leiden  Order: [id number]   Status:  Final result Visible to patient:  Yes (MyChart) Next appt:  05/31/2017 at 11:20 AM in Oncology ([facility]) Dx:  DVT (deep vein thrombosis) in pregnan...   3wk ago  Recommendations-F5LEID: Comment   Comment: (NOTE)  Result: Negative (no mutation found)  Factor V Leiden is a specific mutation (R506Q) in the factor  V gene that is associated with an increased risk of venous  thrombosis. Factor V Leiden is more resistant to  inactivation by activated protein C. As a result, factor V  persists in the circulation leading to a mild hyper-  coagulable state. The Leiden mutation  accounts for 90% -  95% of APC resistance. Factor V Leiden has been reported in  patients with deep vein thrombosis, pulmonary embolus,  central retinal vein occlusion, cerebral sinus thrombosis  and hepatic vein thrombosis. Other risk factors to be  considered in the workup for venous thrombosis include the  G20210A mutation in the factor II (prothrombin) gene,  protein S and C deficiency, and antithrombin deficiencies.  Anticardiolipin antibody and lupus anticoagulant analysis  may be appropriate for certain patients, as well as  homocysteine levels.  Contact your local [facility] for information on how to order  additional testing if desired.  **Genetic counselors are available for health care providers to**  discuss results at 1-800-345-GENE [phone]).  Methodology:  DNA analysis of the Factor V gene was performed by allele-specific  PCR. The diagnostic sensitivity  and specificity is >99% for both.  Molecular-based testing is highly accurate, but as in any  laboratory test, diagnostic errors may occur. All test results must  be combined with clinical information for the most accurate  interpretation.  This test was developed and its performance characteristics  determined by [facility]. It has not been cleared or approved by the  Food and Drug Administration.  References:  Voelkerding K (1996). Clin Lab Med [phone].  Vincenza Hews, PhD, [facility]  Ernestene Mention, PhD, [facility]  Wyatt Portela, M.S., PhD, [facility]  Manya Silvas, PhD, [facility]  Bonnielee Haff, PhD, [facility]  Alpha Gula PhD, [facility]  Performed At: [facility]  [address] [place], [place] [id number]  Oley Balm MD OZ:[id number]   Resulting Agency [facility]    Specimen Collected: 04/05/17 13:47 Last Resulted: 04/09/17 18:31                    Homocysteine, serum  Order: [id number]   Status:  Final result Visible to patient:  Yes (MyChart) Next appt:  05/31/2017 at 11:20 AM in Oncology ([facility]  Lab) Dx:  DVT (deep vein thrombosis) in pregnan...   Ref Range & Units 3wk ago  Homocysteine 0.0 - 15.0 umol/L 5.8   Comment: (NOTE)  Performed At: [facility]  [address] [place], [place] [id number]  Mila Homer MD ZD:[id number]   Resulting Agency  [facility]    Specimen Collected: 04/05/17 13:47 Last Resulted: 04/08/17 14:34                    Lupus anticoagulant panel  Order: [id number]   Status:  Edited Result - FINAL Visible to patient:  Yes (MyChart) Next appt:  05/31/2017 at 11:20 AM in Oncology ([facility]) Dx:  DVT (deep vein thrombosis) in pregnan...   Ref Range & Units 3wk ago  PTT Lupus Anticoagulant 0.0 - 51.9 sec 43.9   DRVVT 0.0 - 47.0 sec 46.8   Lupus Anticoag Interp  Comment:VC   Comment: (NOTE)  No lupus anticoagulant was detected.  Performed At: [facility]  [address] [place], [place] [id number]  Mila Homer MD CZ:[id number]   Resulting Agency  [facility]    Specimen Collected: 04/05/17 13:47 Last Resulted: 04/08/17 16:35            VC=Value has a corrected status           Protein C activity  Order: [id number]   Status:  Final result Visible to patient:  Yes (MyChart) Next appt:  05/31/2017 at 11:20 AM in Oncology ([facility]) Dx:  DVT (deep vein thrombosis) in pregnan...   Ref Range & Units 3wk ago  Protein C Activity 73 - 180 % 129   Comment: (NOTE)  Performed At: [facility]  [address] [place], [place] [id number]  Mila Homer MD WC:[id number]   Resulting Agency  [facility]    Specimen Collected: 04/05/17 13:47 Last Resulted: 04/08/17 16:35                    Protein C, total  Order: [id number]   Status:  Final result Visible to patient:  Yes (MyChart) Next appt:  05/31/2017 at 11:20 AM in Oncology ([facility]) Dx:  DVT (deep vein thrombosis) in pregnan...   Ref Range & Units 3wk ago  Protein C, Total 60 - 150 % 108   Comment: (NOTE)  Performed At: [facility]    [address]  [address] [place], [place] [id number]  Mila Homer MD WU:[id number]   Resulting Agency  [facility]    Specimen Collected: 04/05/17 13:47 Last Resulted: 04/09/17 06:34                    Protein S activity  Order: [id number]   Status:  Final result Visible to patient:  Yes (MyChart) Next appt:  05/31/2017 at 11:20 AM in Oncology ([facility]) Dx:  DVT (deep vein thrombosis) in pregnan...   Ref Range & Units 3wk ago  Protein S Activity 63 - 140 % 63   Comment: (NOTE)  A deficiency of protein S (PS), either congenital or acquired,  increases the risk of thromboembolism. PS activity levels may be  falsely low in individuals with APCR/Factor V Leiden. Consider  performing free protein S antigen in those with APCR/Factor V  Leiden before making a diagnosis of protein S deficiency. Acquired PS  deficiency is more common than congenital deficiency. PS values  decrease with normal pregnancy, and are also dependent on age, sex  and hormone status. PS values tend to be lower in a younger age  group and lower in women than in men. Levels may be decreased in  pre-menopausal women on oral contraceptive agents. Acquired  deficiency can occur as a result of vitamin K deficiency or  antagonism, severe hepatic disorders, (hepatitis, cirrhosis, etc.),  nephrotic syndrome, inflammatory bowel disease, certain  chemotherapeutic agents, L-asparaginse therapy, sepsis, disseminated  intravascular coagulation (DIC) and acute thrombosis. Levels may be  decreased in patients with polycythemia vera, sickle cell disease and  essential thrombocythemia. Repeat evaluation on a new plasma sample  to confirm or refute this result should be considered, after ruling  out acquired causes, depending on the clinical scenario.  Performed At: [facility]  [address] [place], [place] [id number]  Mila Homer MD BM:[id number]   Resulting Agency  [facility]    Specimen Collected: 04/05/17 13:47  Last Resulted: 04/08/17 16:35                    Protein S, total  Order: [id number]   Status:  Final result Visible to patient:  Yes (MyChart) Next appt:  05/31/2017 at 11:20 AM in Oncology ([facility]) Dx:  DVT (deep vein thrombosis) in pregnan...   Ref Range & Units 3wk ago  Protein S Ag, Total 60 - 150 % 85   Comment: (NOTE)  This test was developed and its performance characteristics  determined by [facility]. It has not been cleared or approved  by the Food and Drug Administration.  Performed At: [facility]  [address] [place], [place] [id number]  Mila Homer MD GL:[id number]   Resulting Agency  [facility]    Specimen Collected: 04/05/17 13:47 Last Resulted: 04/08/17 16:35                    Prothrombin gene mutation  Order: [id number]   Status:  Final result Visible to patient:  Yes (MyChart) Next appt:  05/31/2017 at 11:20 AM in Oncology ([facility]) Dx:  DVT (deep vein thrombosis) in pregnan...   3wk ago  Recommendations-PTGENE: Comment   Comment: (NOTE)  NEGATIVE  No mutation identified.  Comment:  A point mutation (G20210A) in the factor II (prothrombin) gene is  the second most common cause of inherited thrombophilia. The  incidence of this mutation in the [place] Caucasian population is about  2% and in the [place] American population it is  approximately 0.5%.  This mutation is rare in the [place] and Native American population.  Being heterozygous for a prothrombin mutation increases the risk for  developing venous thrombosis about 2 to 3 times above the general  population risk. Being homozygous for the prothrombin gene mutation  increases the relative risk for venous thrombosis further, although  it is not yet known how much further the risk is increased. In women  heterozygous for the prothrombin gene mutation, the use of estrogen  containing oral contraceptives increases the relative risk of venous  thrombosis about 16 times and the  risk of developing cerebral  thrombosis is also significantly increased. In pregnancy the  prothrombin gene mutation increases risk for venous thrombosis and  may increase risk for stillbirth, placental abruption, pre-eclampsia  and fetal growth restriction. If the patient possesses two or more  congenital or acquired thrombophilic risk factors, the risk for  thrombosis may rise to more than the sum of the risk ratios for the  individual mutations. This assay detects only the prothrombin G20210A  mutation and does not measure genetic abnormalities elsewhere in the  genome. Other thrombotic risk factors may be pursued through  systematic clinical laboratory analysis. These factors include the  R506Q (Leiden) mutation in the Factor V gene, plasma homocysteine  levels, as well as testing for deficiencies of antithrombin III,  protein C and protein S.  Genetic Counselors are available for health care providers  to discuss results at [phone]-GENE [phone]).  Methodology:  DNA analysis of the Factor II gene was performed by PCR  amplification followed by restriction analysis. The  diagnostic sensitivity is >99% for both. All the tests must  be combined with clinical information for the most accurate  interpretation. Molecular-based testing is highly accurate,  but as in any laboratory test, diagnostic errors may occur.  This test was developed and its performance characteristics  determined by [facility]. It has not been cleared or approved  by the Food and Drug Administration.  Poort SR, et al. Blood. 1996; 96:0454-0981.  Varga EA. Circulation. 2004; 110:e15-e18.  Marland Kitchen, et al. Arterioscler Thromb Vasc Biol. [phone].  Vincenza Hews, PhD, [facility]  Ernestene Mention, PhD, [facility]  Wyatt Portela, M.S., PhD, [facility]  Manya Silvas, PhD, [facility]  Bonnielee Haff, PhD, [facility]  Alpha Gula, PhD, [facility]  Performed At: [facility]  [address] [place], [place] [id number]    Oley Balm MD [id number]   Resulting Agency [facility]    Specimen Collected: 04/05/17 13:47 Last Resulted: 04/10/17 16:36             RADIOGRAPHIC STUDIES: PACS Images   Show images for US Venous Img Lower Unilateral Left  Study Result   CLINICAL DATA:  Left popliteal DVT, follow-up.  Pregnant.  EXAM: LEFT LOWER EXTREMITY VENOUS DOPPLER ULTRASOUND  TECHNIQUE: Gray-scale sonography with compression, as well as color and duplex ultrasound, were performed to evaluate the deep venous system from the level of the common femoral vein through the popliteal and proximal calf veins.  COMPARISON:  02/28/2017  FINDINGS: Normal compressibility of the common femoral, superficial femoral, and popliteal veins, as well as the proximal calf veins. The noncompressible segment of the popliteal vein described previously is no longer identified. No filling defects to suggest DVT on grayscale or color Doppler imaging. Doppler waveforms show normal direction of venous flow, normal respiratory phasicity and response to augmentation. Survey views of the contralateral common femoral vein are unremarkable.  IMPRESSION: No  evidence of lower extremity deep vein thrombosis, left. Apparent interval resolution of left popliteal DVT.   Electronically Signed   By: Corlis Leak M.D.   On: 05/31/2017 14:47     ASSESSMENT & PLAN:  41 year old female in her 3rd month of pregnancy with a history of a provoked PE/DVT in 2006 presents for evaluation of left leg popliteal DVT. Patient's DVT could have been provoked from immobility due to road trip to [place]. She is currently on anticoagulation with Lovenox 120 mg [place] daily. Hypercoagulable workup reviewed with the patient; it was negative.   PLAN: -Patient had a repeat doppler venous US of the LLE on 05/31/17 which demonstrated resolution of previous left popliteal DVT. This was reviewed with the patient. I have advised her she can  stop taking the lovenox [place]. -Her d-dimer is still elevated, however normal pregnancy causes a progressive increase in circulating D-dimer. So the d-dimer is not an accurate measurement of risk for VTE during pregnancy by itself.  -Both of patient's previous DVTs have been provoked by car travel. I have told her that should she have any future car travel she is to ambulate frequently as well as were compression stockings. I have told her that should she gets another PE/DVT in the future, I would recommend lifelong anticoagulation at that point, since she may have something in her blood that makes her hypercoagulable despite a negative hypercoagulable workup. -I will turn her back over to her PCP and OB-GYN. I have advised her to come see me again in the future should she get another PE/DVT.  RTC PRN.  All questions were answered. The patient knows to call the clinic with any problems, questions or concerns.  This note was electronically signed by:  Ralene Cork, MD  06/03/2017 3:06 PM

## 2017-06-10 DIAGNOSIS — N390 Urinary tract infection, site not specified: Secondary | ICD-10-CM | POA: Diagnosis not present

## 2017-06-12 ENCOUNTER — Other Ambulatory Visit (HOSPITAL_COMMUNITY): Payer: Self-pay

## 2017-06-14 DIAGNOSIS — H5213 Myopia, bilateral: Secondary | ICD-10-CM | POA: Diagnosis not present

## 2017-07-11 DIAGNOSIS — Z3480 Encounter for supervision of other normal pregnancy, unspecified trimester: Secondary | ICD-10-CM | POA: Diagnosis not present

## 2017-07-11 DIAGNOSIS — O26849 Uterine size-date discrepancy, unspecified trimester: Secondary | ICD-10-CM | POA: Diagnosis not present

## 2017-07-26 DIAGNOSIS — Z23 Encounter for immunization: Secondary | ICD-10-CM | POA: Diagnosis not present

## 2017-07-31 ENCOUNTER — Ambulatory Visit: Payer: Self-pay

## 2017-08-07 ENCOUNTER — Encounter: Payer: 59 | Attending: Pulmonary Disease | Admitting: Nutrition

## 2017-08-07 VITALS — Ht 60.0 in | Wt 193.0 lb

## 2017-08-07 DIAGNOSIS — O24419 Gestational diabetes mellitus in pregnancy, unspecified control: Secondary | ICD-10-CM

## 2017-08-07 NOTE — Progress Notes (Signed)
Diabetes Self-Management Education  Visit Type: First/Initial  Appt. Start Time: 1030 Appt. End Time: 1130  08/13/2017  [name], identified by name and date of birth, is a 41 y.o. female with a diagnosis of Diabetes: Gestational Diabetes.  Here with her husband/significant other?   Wks: 33 weeks EDU 09-27-17 Previous births 8 lbs 7# and 6 lbs 8 oz.No previous GDM. She notes she has gained about 40 lbs.  UBW 150's. Not exercising. 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  Admits to eating out a lot. Doesn't cook at home much. Diet is usually higher fat, salt and processed foods. Doesn't like a lot of low carb vegetables. No set meal times.She has friends or husband test her blood sugar. She doesn't like needles and doesn't like testing.   FBS 80-90's most of the time. 2 hrs usually less than 120 mg/dl. She is motivated to make changes to maintain normal blood sugars as best she can.  ASSESSMENT  Height 5' (1.524 m), weight 193 lb (87.5 kg). Body mass index is 37.69 kg/m.      Diabetes Self-Management Education - 08/07/17 1043      Visit Information   Visit Type First/Initial     Initial Visit   Diabetes Type Gestational Diabetes   Are you currently following a meal plan? No     Patient was seen on 08-07-17 for Gestational Diabetes self-management class at the [facility]. The following learning objectives were met by the patient during this course:   States the definition of Gestational Diabetes  States why dietary management is important in controlling blood glucose  Describes the effects each nutrient has on blood glucose levels  Demonstrates ability to create a balanced meal plan  Demonstrates carbohydrate counting   States when to check blood glucose levels  Demonstrates proper blood glucose monitoring techniques  States the effect of stress and exercise on blood glucose levels  States the importance of limiting caffeine and  abstaining from alcohol and smoking    Patient instructed to monitor glucose levels: FBS: 60 - <90 1 hour: <140 2 hour: <120  *Patient received handouts:  Nutrition Diabetes and Pregnancy  Carbohydrate Counting List     Individualized Plan for Diabetes Self-Management Training:   Learning Objective:  Patient will have a greater understanding of diabetes self-management. Patient education plan is to attend individual and/or group sessions per assessed needs and concerns.   Plan:  Goals 1. Follow Gestational Meal Plan 2. Eat 30-45 grams of carbs per meal with protein and vegetables. 3. Snacks between meals 15-30 grams with protein 4. Drink only water-cut out juice, tea, soda, lemonades 5. Walk 30 minutes a day. 6. No fruit at breakfast Test before breakfast and 2 hrs after each meal. Bring readings and BS at visits.  Expected Outcomes:  Demonstrated interest in learning. Expect positive outcomes  Education material provided: Gestational packet  If problems or questions, patient to contact team via:  Phone and Email  Future DSME appointment:  (1 week)

## 2017-08-13 DIAGNOSIS — O26849 Uterine size-date discrepancy, unspecified trimester: Secondary | ICD-10-CM | POA: Diagnosis not present

## 2017-08-13 DIAGNOSIS — O09521 Supervision of elderly multigravida, first trimester: Secondary | ICD-10-CM | POA: Diagnosis not present

## 2017-08-13 NOTE — Patient Instructions (Addendum)
Goals 1. Follow Gestational Meal Plan 2. Eat 30-45 grams of carbs per meal with protein and vegetables. 3. Snacks between meals 15-30 grams with protein 4. Drink only water-cut out juice, tea, soda, lemonades 5. Walk 30 minutes a day. 6. No fruit at breakfast Test before breakfast and 2 hrs after each meal. Bring readings and BS at visits.

## 2017-08-20 ENCOUNTER — Other Ambulatory Visit: Payer: Self-pay | Admitting: Pharmacist

## 2017-08-20 ENCOUNTER — Encounter: Payer: 59 | Attending: Obstetrics and Gynecology | Admitting: Nutrition

## 2017-08-20 VITALS — Ht 60.0 in | Wt 180.0 lb

## 2017-08-20 DIAGNOSIS — O24419 Gestational diabetes mellitus in pregnancy, unspecified control: Secondary | ICD-10-CM

## 2017-08-20 MED ORDER — ENOXAPARIN SODIUM 120 MG/0.8ML ~~LOC~~ SOLN: 120.0000 mg | SUBCUTANEOUS | 0 refills | Status: AC

## 2017-08-20 NOTE — Patient Instructions (Addendum)
Goals 1. Eat 30-45 grams of carbs per meal 2. Increase water intake to 4-5 bottles per day 3. Keep walking 4. Increase vegetables 5. Check BS at 4 am to see if low blood sugar. 6. Eat 1 cup yogurt before bed.

## 2017-08-20 NOTE — Progress Notes (Signed)
Diabetes Self-Management Education  Visit Type:    Appt. Start Time: 1030 Appt. End Time: 1130  08/20/2017  [name], identified by name and date of birth, is a 41 y.o. female with a diagnosis of Diabetes:  Marland Kitchen  Here with hersignificant other?    Wks: 34 weeks EDU 09-27-17  Changes made: Has cut out soda, eating more fresh fruits and more salads. FBS;78-97 and 70-120's 2 hours after meals. BS this past week better.  Drinking 2 bottles of water per day. Has lost 13 lbs since last visit 1-1/2 weeks ago. Lips appear very dry. Suspect she may be dehydrated since she isn't drinking much water. Color of urine is mild yellow..   Does wake up at 4 am and is hungry. Suggested she check BS. No reported low blood sugars or symptoms. Has been walking some.    ASSESSMENT  Height 5' (1.524 m), weight 180 lb (81.6 kg). Body mass index is 35.15 kg/m.   Patient was seen on 08-07-17 for Gestational Diabetes self-management class at the [facility]. The following learning objectives were met by the patient during this course:   States the definition of Gestational Diabetes  States why dietary management is important in controlling blood glucose  Describes the effects each nutrient has on blood glucose levels  Demonstrates ability to create a balanced meal plan  Demonstrates carbohydrate counting   States when to check blood glucose levels  Demonstrates proper blood glucose monitoring techniques  States the effect of stress and exercise on blood glucose levels  States the importance of limiting caffeine and abstaining from alcohol and smoking    Patient instructed to monitor glucose levels: FBS: 60 - <90 1 hour: <140 2 hour: <120  *Patient received handouts:  Nutrition Diabetes and Pregnancy  Carbohydrate Counting List  Individualized Plan for Diabetes Self-Management Training:   Learning Objective:  Patient will have a greater understanding  of diabetes self-management. Patient education plan is to attend individual and/or group sessions per assessed needs and concerns.   Plan:  .Goals 1. Eat 30-45 grams of carbs per meal 2. Increase water intake to 4-5 bottles per day 3. Keep walking 4. Increase vegetables 5. Check BS at 4 am to see if low blood sugar. 6. Eat 1 cup yogurt before bed.   Expected Outcomes:     Education material provided: Gestational packet  If problems or questions, patient to contact team via:  Phone and Email  Future DSME appointment:   PRN

## 2017-08-23 DIAGNOSIS — R8271 Bacteriuria: Secondary | ICD-10-CM | POA: Diagnosis not present

## 2017-08-30 DIAGNOSIS — Z3480 Encounter for supervision of other normal pregnancy, unspecified trimester: Secondary | ICD-10-CM | POA: Diagnosis not present

## 2017-08-30 DIAGNOSIS — Z369 Encounter for antenatal screening, unspecified: Secondary | ICD-10-CM | POA: Diagnosis not present

## 2017-08-30 LAB — OB RESULTS CONSOLE GBS: STREP GROUP B AG: POSITIVE

## 2017-09-02 ENCOUNTER — Other Ambulatory Visit: Payer: Self-pay | Admitting: Obstetrics and Gynecology

## 2017-09-06 DIAGNOSIS — O09523 Supervision of elderly multigravida, third trimester: Secondary | ICD-10-CM | POA: Diagnosis not present

## 2017-09-13 DIAGNOSIS — R8271 Bacteriuria: Secondary | ICD-10-CM | POA: Diagnosis not present

## 2017-09-13 DIAGNOSIS — O368199 Decreased fetal movements, unspecified trimester, other fetus: Secondary | ICD-10-CM | POA: Diagnosis not present

## 2017-09-13 DIAGNOSIS — O09523 Supervision of elderly multigravida, third trimester: Secondary | ICD-10-CM | POA: Diagnosis not present

## 2017-09-19 ENCOUNTER — Encounter (HOSPITAL_COMMUNITY): Payer: Self-pay

## 2017-09-20 DIAGNOSIS — O289 Unspecified abnormal findings on antenatal screening of mother: Secondary | ICD-10-CM | POA: Diagnosis not present

## 2017-09-22 ENCOUNTER — Inpatient Hospital Stay (HOSPITAL_COMMUNITY): Payer: 59

## 2017-09-22 ENCOUNTER — Encounter (HOSPITAL_COMMUNITY)
Admission: AD | Disposition: A | Discharge status: Home / Self Care | Payer: Self-pay | Source: Ambulatory Visit | Attending: Obstetrics and Gynecology

## 2017-09-22 ENCOUNTER — Encounter (HOSPITAL_COMMUNITY): Payer: Self-pay | Admitting: *Deleted

## 2017-09-22 ENCOUNTER — Inpatient Hospital Stay (HOSPITAL_COMMUNITY)
Admission: AD | Admit: 2017-09-22 | Discharge: 2017-09-25 | DRG: 788 | Disposition: A | Discharge status: Home / Self Care | Payer: 59 | Source: Ambulatory Visit | Attending: Obstetrics and Gynecology | Admitting: Obstetrics and Gynecology

## 2017-09-22 DIAGNOSIS — D649 Anemia, unspecified: Secondary | ICD-10-CM | POA: Diagnosis present

## 2017-09-22 DIAGNOSIS — O99892 Other specified diseases and conditions complicating childbirth: Secondary | ICD-10-CM

## 2017-09-22 DIAGNOSIS — Z86718 Personal history of other venous thrombosis and embolism: Secondary | ICD-10-CM | POA: Diagnosis not present

## 2017-09-22 DIAGNOSIS — O34211 Maternal care for low transverse scar from previous cesarean delivery: Secondary | ICD-10-CM | POA: Diagnosis present

## 2017-09-22 DIAGNOSIS — Z7901 Long term (current) use of anticoagulants: Secondary | ICD-10-CM

## 2017-09-22 DIAGNOSIS — O43893 Other placental disorders, third trimester: Secondary | ICD-10-CM | POA: Diagnosis not present

## 2017-09-22 DIAGNOSIS — O134 Gestational [pregnancy-induced] hypertension without significant proteinuria, complicating childbirth: Secondary | ICD-10-CM | POA: Diagnosis present

## 2017-09-22 DIAGNOSIS — O9902 Anemia complicating childbirth: Secondary | ICD-10-CM | POA: Diagnosis present

## 2017-09-22 DIAGNOSIS — O99824 Streptococcus B carrier state complicating childbirth: Secondary | ICD-10-CM | POA: Diagnosis present

## 2017-09-22 DIAGNOSIS — O24420 Gestational diabetes mellitus in childbirth, diet controlled: Secondary | ICD-10-CM | POA: Diagnosis present

## 2017-09-22 DIAGNOSIS — O4292 Full-term premature rupture of membranes, unspecified as to length of time between rupture and onset of labor: Principal | ICD-10-CM | POA: Diagnosis present

## 2017-09-22 DIAGNOSIS — O9989 Other specified diseases and conditions complicating pregnancy, childbirth and the puerperium: Secondary | ICD-10-CM

## 2017-09-22 DIAGNOSIS — Z3A38 38 weeks gestation of pregnancy: Secondary | ICD-10-CM

## 2017-09-22 DIAGNOSIS — Z86711 Personal history of pulmonary embolism: Secondary | ICD-10-CM

## 2017-09-22 DIAGNOSIS — O09893 Supervision of other high risk pregnancies, third trimester: Secondary | ICD-10-CM | POA: Diagnosis not present

## 2017-09-22 LAB — CBC
HEMATOCRIT: 33.7 % — AB (ref 36.0–46.0)
HEMOGLOBIN: 11.1 g/dL — AB (ref 12.0–15.0)
MCH: 27.1 pg (ref 26.0–34.0)
MCHC: 32.9 g/dL (ref 30.0–36.0)
MCV: 82.4 fL (ref 78.0–100.0)
Platelets: 219 10*3/uL (ref 150–400)
RBC: 4.09 MIL/uL (ref 3.87–5.11)
RDW: 14.7 % (ref 11.5–15.5)
WBC: 7.2 10*3/uL (ref 4.0–10.5)

## 2017-09-22 LAB — PREPARE RBC (CROSSMATCH)

## 2017-09-22 LAB — ABO/RH: ABO/RH(D): AB POS

## 2017-09-22 LAB — COMPREHENSIVE METABOLIC PANEL
ALK PHOS: 64 U/L (ref 38–126)
ALT: 12 U/L — AB (ref 14–54)
AST: 24 U/L (ref 15–41)
Albumin: 2.5 g/dL — ABNORMAL LOW (ref 3.5–5.0)
Anion gap: 6 (ref 5–15)
BILIRUBIN TOTAL: 0.5 mg/dL (ref 0.3–1.2)
BUN: 7 mg/dL (ref 6–20)
CALCIUM: 8 mg/dL — AB (ref 8.9–10.3)
CO2: 21 mmol/L — ABNORMAL LOW (ref 22–32)
CREATININE: 0.57 mg/dL (ref 0.44–1.00)
Chloride: 106 mmol/L (ref 101–111)
Glucose, Bld: 145 mg/dL — ABNORMAL HIGH (ref 65–99)
Potassium: 3.3 mmol/L — ABNORMAL LOW (ref 3.5–5.1)
Sodium: 133 mmol/L — ABNORMAL LOW (ref 135–145)
Total Protein: 5.7 g/dL — ABNORMAL LOW (ref 6.5–8.1)

## 2017-09-22 LAB — RPR: RPR: NONREACTIVE

## 2017-09-22 SURGERY — Surgical Case
Anesthesia: General | Wound class: Clean Contaminated

## 2017-09-22 MED ORDER — SOD CITRATE-CITRIC ACID 500-334 MG/5ML PO SOLN
30.0000 mL | Freq: Once | ORAL | Status: AC
Start: 2017-09-22 — End: 2017-09-22
  Administered 2017-09-22: 30 mL via ORAL
  Filled 2017-09-22: qty 15

## 2017-09-22 MED ORDER — HYDROMORPHONE HCL 1 MG/ML IJ SOLN
0.2500 mg | INTRAMUSCULAR | Status: DC | PRN
  Administered 2017-09-22 (×4): 0.5 mg via INTRAVENOUS

## 2017-09-22 MED ORDER — MENTHOL 3 MG MT LOZG
1.0000 | LOZENGE | OROMUCOSAL | Status: DC | PRN
  Administered 2017-09-22: 3 mg via ORAL
  Filled 2017-09-22: qty 9

## 2017-09-22 MED ORDER — SODIUM CHLORIDE 0.9% FLUSH: 9.0000 mL | INTRAVENOUS | Status: DC | PRN

## 2017-09-22 MED ORDER — SUCCINYLCHOLINE CHLORIDE 200 MG/10ML IV SOSY
PREFILLED_SYRINGE | INTRAVENOUS | Status: AC
  Filled 2017-09-22: qty 10

## 2017-09-22 MED ORDER — PROPOFOL 10 MG/ML IV BOLUS
INTRAVENOUS | Status: DC | PRN
  Administered 2017-09-22: 200 mg via INTRAVENOUS
  Administered 2017-09-22: 40 mg via INTRAVENOUS

## 2017-09-22 MED ORDER — OXYCODONE-ACETAMINOPHEN 5-325 MG PO TABS
1.0000 | ORAL_TABLET | ORAL | Status: DC | PRN
  Administered 2017-09-23 – 2017-09-24 (×3): 1 via ORAL
  Filled 2017-09-22 (×3): qty 1

## 2017-09-22 MED ORDER — TETANUS-DIPHTH-ACELL PERTUSSIS 5-2.5-18.5 LF-MCG/0.5 IM SUSP: 0.5000 mL | Freq: Once | INTRAMUSCULAR | Status: DC

## 2017-09-22 MED ORDER — HYDROMORPHONE HCL 1 MG/ML IJ SOLN
INTRAMUSCULAR | Status: AC
  Filled 2017-09-22: qty 1

## 2017-09-22 MED ORDER — PROTAMINE SULFATE 10 MG/ML IV SOLN
Freq: Once | INTRAVENOUS | Status: AC
  Administered 2017-09-22: 07:00:00 via INTRAVENOUS
  Filled 2017-09-22: qty 50

## 2017-09-22 MED ORDER — LABETALOL HCL 5 MG/ML IV SOLN
INTRAVENOUS | Status: AC
  Filled 2017-09-22: qty 8

## 2017-09-22 MED ORDER — SENNOSIDES-DOCUSATE SODIUM 8.6-50 MG PO TABS
2.0000 | ORAL_TABLET | ORAL | Status: DC
  Administered 2017-09-22 – 2017-09-25 (×3): 2 via ORAL
  Filled 2017-09-22 (×3): qty 2

## 2017-09-22 MED ORDER — ACETAMINOPHEN 325 MG PO TABS
650.0000 mg | ORAL_TABLET | ORAL | Status: DC | PRN
  Administered 2017-09-22: 650 mg via ORAL
  Filled 2017-09-22: qty 2

## 2017-09-22 MED ORDER — SODIUM CHLORIDE 0.9 % IV SOLN
INTRAVENOUS | Status: DC | PRN
  Administered 2017-09-22: 08:00:00 via INTRAVENOUS

## 2017-09-22 MED ORDER — LABETALOL HCL 5 MG/ML IV SOLN
INTRAVENOUS | Status: AC
  Filled 2017-09-22: qty 4

## 2017-09-22 MED ORDER — LABETALOL HCL 5 MG/ML IV SOLN
INTRAVENOUS | Status: DC | PRN
  Administered 2017-09-22 (×2): 20 mg via INTRAVENOUS

## 2017-09-22 MED ORDER — OXYCODONE-ACETAMINOPHEN 5-325 MG PO TABS
2.0000 | ORAL_TABLET | ORAL | Status: DC | PRN
  Administered 2017-09-23 (×2): 2 via ORAL
  Filled 2017-09-22 (×2): qty 2

## 2017-09-22 MED ORDER — ZOLPIDEM TARTRATE 5 MG PO TABS: 5.0000 mg | ORAL_TABLET | Freq: Every evening | ORAL | Status: DC | PRN

## 2017-09-22 MED ORDER — LACTATED RINGERS IV SOLN
INTRAVENOUS | Status: DC
  Administered 2017-09-22: 12:00:00 via INTRAVENOUS
  Administered 2017-09-23: 999 mL via INTRAVENOUS

## 2017-09-22 MED ORDER — IBUPROFEN 600 MG PO TABS
600.0000 mg | ORAL_TABLET | Freq: Four times a day (QID) | ORAL | Status: DC
  Administered 2017-09-22 – 2017-09-25 (×12): 600 mg via ORAL
  Filled 2017-09-22 (×12): qty 1

## 2017-09-22 MED ORDER — PRENATAL MULTIVITAMIN CH
1.0000 | ORAL_TABLET | Freq: Every day | ORAL | Status: DC
  Administered 2017-09-23 – 2017-09-25 (×3): 1 via ORAL
  Filled 2017-09-22 (×3): qty 1

## 2017-09-22 MED ORDER — DIBUCAINE 1 % RE OINT: 1.0000 "application " | TOPICAL_OINTMENT | RECTAL | Status: DC | PRN

## 2017-09-22 MED ORDER — HYDROMORPHONE 1 MG/ML IV SOLN
INTRAVENOUS | Status: DC
  Administered 2017-09-22: 12:00:00 via INTRAVENOUS
  Administered 2017-09-22: 1.1 mL via INTRAVENOUS
  Administered 2017-09-22: 0.6 mL via INTRAVENOUS
  Administered 2017-09-22: 0 mg via INTRAVENOUS
  Administered 2017-09-23: 0.3 mg via INTRAVENOUS
  Administered 2017-09-23: 0 mL via INTRAVENOUS
  Administered 2017-09-23: 0.3 mg via INTRAVENOUS
  Filled 2017-09-22: qty 25

## 2017-09-22 MED ORDER — WITCH HAZEL-GLYCERIN EX PADS: 1.0000 "application " | MEDICATED_PAD | CUTANEOUS | Status: DC | PRN

## 2017-09-22 MED ORDER — FENTANYL CITRATE (PF) 250 MCG/5ML IJ SOLN
INTRAMUSCULAR | Status: AC
  Filled 2017-09-22: qty 5

## 2017-09-22 MED ORDER — CEFAZOLIN SODIUM-DEXTROSE 2-4 GM/100ML-% IV SOLN
2.0000 g | INTRAVENOUS | Status: AC
  Administered 2017-09-22: 2 g via INTRAVENOUS

## 2017-09-22 MED ORDER — PROPOFOL 10 MG/ML IV BOLUS
INTRAVENOUS | Status: AC
  Filled 2017-09-22: qty 40

## 2017-09-22 MED ORDER — LACTATED RINGERS IV SOLN
INTRAVENOUS | Status: DC
  Administered 2017-09-22: 06:00:00 via INTRAVENOUS

## 2017-09-22 MED ORDER — DIPHENHYDRAMINE HCL 25 MG PO CAPS: 25.0000 mg | ORAL_CAPSULE | Freq: Four times a day (QID) | ORAL | Status: DC | PRN

## 2017-09-22 MED ORDER — NALOXONE HCL 0.4 MG/ML IJ SOLN: 0.4000 mg | INTRAMUSCULAR | Status: DC | PRN

## 2017-09-22 MED ORDER — SUCCINYLCHOLINE CHLORIDE 20 MG/ML IJ SOLN
INTRAMUSCULAR | Status: DC | PRN
  Administered 2017-09-22: 140 mg via INTRAVENOUS

## 2017-09-22 MED ORDER — ESMOLOL HCL 100 MG/10ML IV SOLN
INTRAVENOUS | Status: AC
  Filled 2017-09-22: qty 10

## 2017-09-22 MED ORDER — PROTAMINE SULFATE 10 MG/ML IV SOLN
120.0000 mg | Freq: Once | INTRAVENOUS | Status: DC
  Filled 2017-09-22: qty 12

## 2017-09-22 MED ORDER — LACTATED RINGERS IV SOLN
INTRAVENOUS | Status: DC | PRN
  Administered 2017-09-22: 09:00:00 via INTRAVENOUS

## 2017-09-22 MED ORDER — DEXAMETHASONE SODIUM PHOSPHATE 10 MG/ML IJ SOLN
INTRAMUSCULAR | Status: AC
  Filled 2017-09-22: qty 1

## 2017-09-22 MED ORDER — ENOXAPARIN SODIUM 120 MG/0.8ML ~~LOC~~ SOLN
120.0000 mg | SUBCUTANEOUS | Status: DC
  Administered 2017-09-23 – 2017-09-25 (×3): 120 mg via SUBCUTANEOUS
  Filled 2017-09-22 (×3): qty 0.8

## 2017-09-22 MED ORDER — ONDANSETRON HCL 4 MG/2ML IJ SOLN
INTRAMUSCULAR | Status: DC | PRN
  Administered 2017-09-22: 4 mg via INTRAVENOUS

## 2017-09-22 MED ORDER — MIDAZOLAM HCL 2 MG/2ML IJ SOLN
INTRAMUSCULAR | Status: AC
  Filled 2017-09-22: qty 2

## 2017-09-22 MED ORDER — OXYCODONE HCL 5 MG/5ML PO SOLN: 5.0000 mg | Freq: Once | ORAL | Status: DC | PRN

## 2017-09-22 MED ORDER — SCOPOLAMINE 1 MG/3DAYS TD PT72
MEDICATED_PATCH | TRANSDERMAL | Status: DC | PRN
  Administered 2017-09-22: 1 via TRANSDERMAL

## 2017-09-22 MED ORDER — DEXAMETHASONE SODIUM PHOSPHATE 10 MG/ML IJ SOLN
INTRAMUSCULAR | Status: DC | PRN
  Administered 2017-09-22: 10 mg via INTRAVENOUS

## 2017-09-22 MED ORDER — ONDANSETRON HCL 4 MG/2ML IJ SOLN
INTRAMUSCULAR | Status: AC
  Filled 2017-09-22: qty 2

## 2017-09-22 MED ORDER — MEPERIDINE HCL 25 MG/ML IJ SOLN: 6.2500 mg | INTRAMUSCULAR | Status: DC | PRN

## 2017-09-22 MED ORDER — PROMETHAZINE HCL 25 MG/ML IJ SOLN: 6.2500 mg | INTRAMUSCULAR | Status: DC | PRN

## 2017-09-22 MED ORDER — MIDAZOLAM HCL 2 MG/2ML IJ SOLN
INTRAMUSCULAR | Status: DC | PRN
  Administered 2017-09-22: 2 mg via INTRAVENOUS

## 2017-09-22 MED ORDER — OXYCODONE HCL 5 MG PO TABS: 5.0000 mg | ORAL_TABLET | Freq: Once | ORAL | Status: DC | PRN

## 2017-09-22 MED ORDER — SIMETHICONE 80 MG PO CHEW
80.0000 mg | CHEWABLE_TABLET | Freq: Three times a day (TID) | ORAL | Status: DC
  Administered 2017-09-22 – 2017-09-25 (×9): 80 mg via ORAL
  Filled 2017-09-22 (×8): qty 1

## 2017-09-22 MED ORDER — COCONUT OIL OIL: 1.0000 "application " | TOPICAL_OIL | Status: DC | PRN

## 2017-09-22 MED ORDER — DIPHENHYDRAMINE HCL 12.5 MG/5ML PO ELIX
12.5000 mg | ORAL_SOLUTION | Freq: Four times a day (QID) | ORAL | Status: DC | PRN
  Filled 2017-09-22: qty 5

## 2017-09-22 MED ORDER — FENTANYL CITRATE (PF) 100 MCG/2ML IJ SOLN
INTRAMUSCULAR | Status: DC | PRN
  Administered 2017-09-22: 100 ug via INTRAVENOUS
  Administered 2017-09-22: 150 ug via INTRAVENOUS
  Administered 2017-09-22: 250 ug via INTRAVENOUS

## 2017-09-22 MED ORDER — FAMOTIDINE IN NACL 20-0.9 MG/50ML-% IV SOLN
20.0000 mg | Freq: Once | INTRAVENOUS | Status: AC
  Administered 2017-09-22: 20 mg via INTRAVENOUS
  Filled 2017-09-22: qty 50

## 2017-09-22 MED ORDER — FENTANYL CITRATE (PF) 250 MCG/5ML IJ SOLN
INTRAMUSCULAR | Status: AC
Start: 2017-09-22 — End: ?
  Filled 2017-09-22: qty 5

## 2017-09-22 MED ORDER — SIMETHICONE 80 MG PO CHEW
80.0000 mg | CHEWABLE_TABLET | ORAL | Status: DC
  Administered 2017-09-22 – 2017-09-23 (×2): 80 mg via ORAL
  Filled 2017-09-22 (×3): qty 1

## 2017-09-22 MED ORDER — SCOPOLAMINE 1 MG/3DAYS TD PT72
MEDICATED_PATCH | TRANSDERMAL | Status: AC
  Filled 2017-09-22: qty 1

## 2017-09-22 MED ORDER — OXYTOCIN 40 UNITS IN LACTATED RINGERS INFUSION - SIMPLE MED: 2.5000 [IU]/h | INTRAVENOUS | Status: AC

## 2017-09-22 MED ORDER — DIPHENHYDRAMINE HCL 50 MG/ML IJ SOLN: 12.5000 mg | Freq: Four times a day (QID) | INTRAMUSCULAR | Status: DC | PRN

## 2017-09-22 MED ORDER — ONDANSETRON HCL 4 MG/2ML IJ SOLN: 4.0000 mg | Freq: Four times a day (QID) | INTRAMUSCULAR | Status: DC | PRN

## 2017-09-22 MED ORDER — SIMETHICONE 80 MG PO CHEW: 80.0000 mg | CHEWABLE_TABLET | ORAL | Status: DC | PRN

## 2017-09-22 MED ORDER — OXYTOCIN 10 UNIT/ML IJ SOLN
INTRAVENOUS | Status: DC | PRN
  Administered 2017-09-22: 40 [IU] via INTRAVENOUS

## 2017-09-22 MED ORDER — ESMOLOL HCL 100 MG/10ML IV SOLN
INTRAVENOUS | Status: DC | PRN
  Administered 2017-09-22: 100 mg via INTRAVENOUS

## 2017-09-22 SURGICAL SUPPLY — 35 items
CHLORAPREP W/TINT 26ML (MISCELLANEOUS) ×3 IMPLANT
CLAMP CORD UMBIL (MISCELLANEOUS) IMPLANT
CLOSURE WOUND 1/4X4 (GAUZE/BANDAGES/DRESSINGS) ×1
CLOTH BEACON ORANGE TIMEOUT ST (SAFETY) ×3 IMPLANT
DERMABOND ADHESIVE PROPEN (GAUZE/BANDAGES/DRESSINGS) ×2
DERMABOND ADVANCED .7 DNX6 (GAUZE/BANDAGES/DRESSINGS) ×1 IMPLANT
DRSG OPSITE POSTOP 4X10 (GAUZE/BANDAGES/DRESSINGS) ×3 IMPLANT
ELECT REM PT RETURN 9FT ADLT (ELECTROSURGICAL) ×3
ELECTRODE REM PT RTRN 9FT ADLT (ELECTROSURGICAL) ×1 IMPLANT
EXTRACTOR VACUUM M CUP 4 TUBE (SUCTIONS) ×2 IMPLANT
EXTRACTOR VACUUM M CUP 4' TUBE (SUCTIONS) ×1
GLOVE BIOGEL PI IND STRL 6.5 (GLOVE) ×1 IMPLANT
GLOVE BIOGEL PI IND STRL 7.0 (GLOVE) ×1 IMPLANT
GLOVE BIOGEL PI INDICATOR 6.5 (GLOVE) ×2
GLOVE BIOGEL PI INDICATOR 7.0 (GLOVE) ×2
GLOVE ECLIPSE 6.5 STRL STRAW (GLOVE) ×3 IMPLANT
GOWN STRL REUS W/TWL LRG LVL3 (GOWN DISPOSABLE) ×6 IMPLANT
KIT ABG SYR 3ML LUER SLIP (SYRINGE) IMPLANT
NEEDLE HYPO 25X5/8 SAFETYGLIDE (NEEDLE) IMPLANT
NS IRRIG 1000ML POUR BTL (IV SOLUTION) ×3 IMPLANT
PACK C SECTION WH (CUSTOM PROCEDURE TRAY) ×3 IMPLANT
PAD ABD 7.5X8 STRL (GAUZE/BANDAGES/DRESSINGS) ×3 IMPLANT
PAD OB MATERNITY 4.3X12.25 (PERSONAL CARE ITEMS) ×3 IMPLANT
PENCIL SMOKE EVAC W/HOLSTER (ELECTROSURGICAL) ×3 IMPLANT
RTRCTR C-SECT PINK 25CM LRG (MISCELLANEOUS) ×3 IMPLANT
STRIP CLOSURE SKIN 1/4X4 (GAUZE/BANDAGES/DRESSINGS) ×2 IMPLANT
SUT MON AB 2-0 CT1 27 (SUTURE) ×3 IMPLANT
SUT MON AB 4-0 PS1 27 (SUTURE) IMPLANT
SUT PDS AB 0 CTX 60 (SUTURE) IMPLANT
SUT PLAIN 2 0 XLH (SUTURE) IMPLANT
SUT VIC AB 0 CTX 36 (SUTURE) ×8
SUT VIC AB 0 CTX36XBRD ANBCTRL (SUTURE) ×4 IMPLANT
SUT VIC AB 4-0 KS 27 (SUTURE) IMPLANT
TOWEL OR 17X24 6PK STRL BLUE (TOWEL DISPOSABLE) ×3 IMPLANT
TRAY FOLEY BAG SILVER LF 14FR (SET/KITS/TRAYS/PACK) ×3 IMPLANT

## 2017-09-22 NOTE — Anesthesia Procedure Notes (Cosign Needed)
Procedure Name: Intubation Date/Time: 09/22/2017 8:27 AM Performed by: Lewie LoronGERMEROTH, JOHN Pre-anesthesia Checklist: Patient identified, Timeout performed, Emergency Drugs available, Suction available and Patient being monitored Patient Re-evaluated:Patient Re-evaluated prior to induction Oxygen Delivery Method: Circle system utilized Preoxygenation: Pre-oxygenation with 100% oxygen Induction Type: IV induction, Rapid sequence and Cricoid Pressure applied Laryngoscope Size: Glidescope and 3 Grade View: Grade I Tube type: Oral Tube size: 7.0 mm Number of attempts: 1 Airway Equipment and Method: Rigid stylet Placement Confirmation: ETT inserted through vocal cords under direct vision,  positive ETCO2,  CO2 detector and breath sounds checked- equal and bilateral Secured at: 21 cm Tube secured with: Tape Dental Injury: Teeth and Oropharynx as per pre-operative assessment

## 2017-09-22 NOTE — Progress Notes (Signed)
Primed tubing for Dilaudid PCA. Wasted 3 ml of dilaudid in sink. Vivi MartensAshley Billings, RN witnessed waste.

## 2017-09-22 NOTE — Anesthesia Preprocedure Evaluation (Addendum)
Anesthesia Evaluation  Patient identified by MRN, date of birth, ID band Patient awake    Reviewed: Allergy & Precautions, NPO status , Patient's Chart, lab work & pertinent test results  Airway Mallampati: II       Dental no notable dental hx. (+) Teeth Intact   Pulmonary neg pulmonary ROS,    Pulmonary exam normal breath sounds clear to auscultation       Cardiovascular + DVT   Rhythm:Regular Rate:Tachycardia     Neuro/Psych negative neurological ROS  negative psych ROS   GI/Hepatic negative GI ROS, Neg liver ROS,   Endo/Other  negative endocrine ROS  Renal/GU negative Renal ROS     Musculoskeletal negative musculoskeletal ROS (+)   Abdominal (+) + obese,   Peds  Hematology negative hematology ROS (+) anemia ,   Anesthesia Other Findings   Reproductive/Obstetrics (+) Pregnancy                            Anesthesia Physical Anesthesia Plan  ASA: III  Anesthesia Plan: General   Post-op Pain Management:    Induction: Intravenous, Rapid sequence and Cricoid pressure planned  PONV Risk Score and Plan: 4 or greater and Ondansetron, Dexamethasone, Midazolam and Scopolamine patch - Pre-op  Airway Management Planned: Oral ETT  Additional Equipment:   Intra-op Plan:   Post-operative Plan: Extubation in OR  Informed Consent: I have reviewed the patients History and Physical, chart, labs and discussed the procedure including the risks, benefits and alternatives for the proposed anesthesia with the patient or authorized representative who has indicated his/her understanding and acceptance.   Dental advisory given  Plan Discussed with: CRNA  Anesthesia Plan Comments:         Anesthesia Quick Evaluation

## 2017-09-22 NOTE — MAU Note (Signed)
Leaking clear fld since 0100. Some lower back pain. For repeat c/s on 11/2. GDM diet controlled

## 2017-09-22 NOTE — MAU Note (Signed)
PT  SAYS SROM AT 0100-   CLEAR .     IS SCH   C/ S   FOR  09-27-2017   -  REPEAT.    TAKES LOVENOX-  TOOK LAST NIGHT AT 8PM .     NO  UC'S

## 2017-09-22 NOTE — MAU Note (Signed)
Call received ready for pt in OR. Discussed with Angie, CRNA.  Per pharmacy, IV line needs to be flushed for after infusion of Protamine, will be up at 0810.  2nd line placement requested by CRNA.  Pt's nurse states pt was stuck 4 times for present IV. Will bring pt to OR.

## 2017-09-22 NOTE — Progress Notes (Signed)
   09/22/17 1616  Vital Signs  BP (!) 147/83  BP Location Left Arm  Pulse Rate 68  POSS Scale (Pasero Opioid Sedation Scale)  POSS *See Group Information* 1-Acceptable,Awake and alert  MD notified of BP above.  MD gave verbal instruction to call her if BP is greater than 160/110. Will continue to monitor.

## 2017-09-22 NOTE — Anesthesia Postprocedure Evaluation (Signed)
Anesthesia Post Note  Patient: Sydney Holloway  Procedure(s) Performed: CESAREAN SECTION (N/A )     Patient location during evaluation: PACU Anesthesia Type: General Level of consciousness: awake Pain management: pain level controlled Vital Signs Assessment: post-procedure vital signs reviewed and stable Respiratory status: spontaneous breathing Cardiovascular status: stable Postop Assessment: no apparent nausea or vomiting Anesthetic complications: no    Last Vitals:  Vitals:   09/22/17 1000 09/22/17 1015  BP: (!) 145/89 (!) 139/94  Pulse: 96 97  Resp: 18 (!) 24  Temp:    SpO2: 100% 100%    Last Pain:  Vitals:   09/22/17 1015  TempSrc:   PainSc: 5    Pain Goal: Patients Stated Pain Goal: 0 (09/22/17 0438)               Cassidie Veiga JR,JOHN Susann GivensFRANKLIN

## 2017-09-22 NOTE — Progress Notes (Signed)
Notified Dr. Tenny Crawoss of patient's BP of 156/94.  Per MD, would like CMP drawn.  Orders placed, lab states they don't have enough blood to run test. Lab to come draw blood work. Will continue to monitor.

## 2017-09-22 NOTE — Op Note (Signed)
Pre-Operative Diagnosis: 1) 38+2-week intrauterine pregnancy 2) premature rupture of membranes 3) history of prior cesarean section x2 4) advanced maternal age 235) 3 therapeutic anticoagulation Postoperative Diagnosis: 1) 38+2-week intrauterine pregnancy 2) premature rupture of membranes 3) history of prior cesarean section x2 4) advanced maternal age 855) 3 therapeutic anticoagulation  Procedure: Repeat low transverse cesarean section under general anesthesia Surgeon: Dr. Waynard ReedsKendra Doral Ventrella Assistant: None Operative Findings: Uterus female infant in the vertex presentation.  1 minute Apgar was 3, 5-minute Apgar was 7, 10-minute Apgar of 9.  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  Dense scar tissue was noted in the subcutaneous tissue. Specimen: Placenta to pathology EBL: Total I/O In: 1500 [I.V.:1500] Out: 1066 [Urine:100; Blood:966]   Procedure:Sydney Holloway is an 41 year old gravida 3 para 2002 at 8538 weeks and 2 days estimated gestational age who presents for cesarean section.  The patient has a history of a DVT during this pregnancy and has been on therapeutic anticoagulation.  Protamine sulfate 120 mg was administered prior to the procedure for partial reversal of her anticoagulation.  The patient was scheduled for repeat cesarean section on Friday, November 2, however she had premature rupture of membranes at 1 AM on the day of admission.  Due to therapeutic anticoagulation general anesthesia was required.  Following the appropriate informed consent the patient was brought to the operating room.  She was appropriately identified in the operating room during a preoperative timeout procedure.  General anesthesia was administered and found to be adequate. She was placed in the dorsal supine position with a leftward tilt. She was prepped and draped in the normal sterile fashion. Scalpel was then used to make a Pfannenstiel skin incision which was carried down to the underlying  layers of soft tissue to the fascia. The fascia was incised in the midline and the fascial incision was extended laterally with Mayo scissors. The superior aspect of the fascial incision was grasped with Coker clamps x2, tented up and the rectus muscles dissected off sharply with the electrocautery unit area and the same procedure was repeated on the inferior aspect of the fascial incision. The rectus muscles were separated in the midline. The abdominal peritoneum was identified, tented up, entered sharply, and the incision was extended superiorly and inferiorly with good visualization of the bladder. The Alexis retractor was then deployed.  Adhesive disease between the omentum, anterior abdominal wall, and lower uterine segment was taken down with both blunt and sharp dissection.  A scalpel was then used to make a low transverse incision on the uterus.  This incision was extended laterally with blunt dissection.  The vacuum was used to assist with delivery of the fetal vertex through the uterine incision.  The initial attempt at delivery of the vertex was unsuccessful.  The vacuum was disengaged.  The banded scissors were used to extend the uterine incision superiorly at the left hand aspect of the incision.  The vacuum was replaced and the vertex was delivered easily through the uterine incision followed by the body.  The cord was clamped and cut and the infant was passed to the waiting NICU team.  Due to general anesthesia a 1 minute delay in cord clamping was not attempted.  The placenta was then spontaneously delivered and the uterus was cleared of all clot and debris.  The uterine incision extension was repaired with #1 chromic in a running locked fashion in 2 layers.  A 4-0 Vicryl baseball stitch was performed on the vesicouterine peritoneum.  The low transverse incision was then repaired with #1 chromic in a running locked fashion in a single layer.  Hemostasis was achieved with a single layer and an  imbricating layer was not attempted.  The ovaries, and tubes were normal bilaterally.  The peritoneum was reapproximated with 2-0 Vicryl in a running fashion.  The rectus muscle was reapproximated with 2-0 chromic in a running fashion.  The fascia was reapproximated with a looped PDS in a running fashion.  The skin was closed with 4-0 Vicryl in a subcuticular fashion and Dermabond.  A pressure dressing was placed over the honeycomb dressing.  All sponge, lap, needle counts were correct x3.  Get this completed the operative procedure.  The patient was extubated in the operating room and transferred to the PACU in stable condition.

## 2017-09-22 NOTE — H&P (Signed)
Sydney Holloway is a 41 y.o. female presenting for leaking fluid  41 yo G3P2002 @ 38+2 presents to MAU complaining of inability to hold her urine since 1 am. She was confirmed to have spontaneously ruptured membranes.   Her pregnancy has been complicated by Advanced maternal age, a DVT at 12 weeks, and A1 Diabetes mellitus.  She has been on therapeutic lovenox @ 120 mg [place] Qday  OB History    Gravida Para Term Preterm AB Living   3 2 1 1   2    SAB TAB Ectopic Multiple Live Births           2     Past Medical History:  Diagnosis Date  . AMA (advanced maternal age) multigravida 35+   . Anemia   . DVT (deep vein thrombosis) in pregnancy (HCC) 02/28/2017   l leg  . DVT of leg (deep venous thrombosis) (HCC)    right  . Pulmonary embolus (HCC)    bilateral lungs   Past Surgical History:  Procedure Laterality Date  . CESAREAN SECTION    . KNEE SURGERY     Family History: family history includes Bell's palsy in her mother; Breast cancer in her father; Clotting disorder in her maternal aunt; Diabetes in her father, maternal aunt, mother, and paternal grandmother; Kidney disease in her father. Social History:  reports that she has never smoked. She has never used smokeless tobacco. She reports that she does not drink alcohol or use drugs.     Maternal Diabetes: Yes:  Diabetes Type:  Diet controlled Genetic Screening: Normal Maternal Ultrasounds/Referrals: Normal Fetal Ultrasounds or other Referrals:  None Maternal Substance Abuse:  No Significant Maternal Medications:  Meds include: Other:  Significant Maternal Lab Results:  None Other Comments:  None  ROS History Dilation: Fingertip Effacement (%): 60 Exam by:: DCALLAWAY, RN Blood pressure (!) 156/84, pulse 86, temperature 98.3 F (36.8 C), temperature source Oral, resp. rate 20, height 5' (1.524 m), weight 84.4 kg (186 lb). Exam Physical Exam  Prenatal labs: ABO, Rh: AB/Positive/-- (04/20 0000) Antibody: Negative  (04/20 0000) Rubella: Immune (04/20 0000) RPR: Nonreactive (04/20 0000)  HBsAg: Negative (04/20 0000)  HIV: Non-reactive (04/20 0000)  GBS: Positive (10/05 0000)   Assessment/Plan: 1) Admit 2) Pts last dose of lovenox was at 8pm. Given repeat cesarean section will administer protamine sulfate 120mg  IV pre-operatively. While this will not completely reverse the anticoagulant effect of lovenonx it may neurtralize higher molecular weight fractions which are most responsible for bleeding. 3) Pt will require general anesthesia 4) Will type and cross for 2 units given risk of bleeding 5) Pt desires tubal sterilization.   6) R/B/A reviewed and the patient wishes to proceed  Sydney Holloway H. 09/22/2017, 5:52 AM

## 2017-09-22 NOTE — Transfer of Care (Signed)
Immediate Anesthesia Transfer of Care Note  Patient: Sydney Holloway  Procedure(s) Performed: CESAREAN SECTION (N/A )  Patient Location: PACU  Anesthesia Type:General  Level of Consciousness: awake, alert  and oriented  Airway & Oxygen Therapy: Patient Spontanous Breathing and Patient connected to nasal cannula oxygen  Post-op Assessment: Report given to RN and Post -op Vital signs reviewed and stable  Post vital signs: Reviewed and stable  Last Vitals:  Vitals:   09/22/17 0722 09/22/17 0727  BP:    Pulse:    Resp:    Temp:    SpO2: 99% 98%    Last Pain:  Vitals:   09/22/17 0501  TempSrc: Oral  PainSc:       Patients Stated Pain Goal: 0 (09/22/17 0438)  Complications: No apparent anesthesia complications

## 2017-09-22 NOTE — Anesthesia Postprocedure Evaluation (Cosign Needed)
Anesthesia Post Note  Patient: Charna ElizabethYolanda M Johnson  Procedure(s) Performed: CESAREAN SECTION (N/A )     Patient location during evaluation: Mother Baby Anesthesia Type: General Level of consciousness: awake, awake and alert, oriented and patient cooperative Pain management: satisfactory to patient (Patient reports recently hitting her PCA button) Vital Signs Assessment: post-procedure vital signs reviewed and stable Respiratory status: spontaneous breathing, nonlabored ventilation, respiratory function stable and patient connected to nasal cannula oxygen Cardiovascular status: stable and blood pressure returned to baseline Postop Assessment: no headache, no backache, patient able to bend at knees, no apparent nausea or vomiting and adequate PO intake Anesthetic complications: no    Last Vitals:  Vitals:   09/22/17 1246 09/22/17 1329  BP: (!) 155/81 (!) 166/85  Pulse: 72 71  Resp: 19 17  Temp: 36.7 C 36.7 C  SpO2: 97% 99%    Last Pain:  Vitals:   09/22/17 1329  TempSrc: Axillary  PainSc: 8    Pain Goal: Patients Stated Pain Goal: 3 (09/22/17 1329)               Ballard RussellLeah  Tej Murdaugh

## 2017-09-23 LAB — CBC
HCT: 26.2 % — ABNORMAL LOW (ref 36.0–46.0)
Hemoglobin: 8.5 g/dL — ABNORMAL LOW (ref 12.0–15.0)
MCH: 26.8 pg (ref 26.0–34.0)
MCHC: 32.4 g/dL (ref 30.0–36.0)
MCV: 82.6 fL (ref 78.0–100.0)
PLATELETS: 192 10*3/uL (ref 150–400)
RBC: 3.17 MIL/uL — AB (ref 3.87–5.11)
RDW: 14.8 % (ref 11.5–15.5)
WBC: 11.1 10*3/uL — ABNORMAL HIGH (ref 4.0–10.5)

## 2017-09-23 LAB — GLUCOSE, CAPILLARY: GLUCOSE-CAPILLARY: 142 mg/dL — AB (ref 65–99)

## 2017-09-23 MED ORDER — NIFEDIPINE ER OSMOTIC RELEASE 30 MG PO TB24
30.0000 mg | ORAL_TABLET | Freq: Every day | ORAL | Status: DC
  Administered 2017-09-23 – 2017-09-25 (×3): 30 mg via ORAL
  Filled 2017-09-23 (×3): qty 1

## 2017-09-23 NOTE — Progress Notes (Signed)
Bp at 1900 163/82. Pt asymptomatic. Pain level of 7, percocet 2 tabs given at 1921. BP rechecked at 2008 and was 146/77. Dr. Chestine Sporelark called and made aware. Verbal orders to recheck BP in 2 hours then Q4 after that. Told to call back if BP is greater than 160/100. Will continue to monitor.

## 2017-09-23 NOTE — Progress Notes (Signed)
Patient is doing well.  She is tolerating PO, ambulating.  Foley catheter was not removed overnight.  Is still on a PCA (required general anesthesia)  Pain is controlled--mild right-sided pain.  Lochia is appropriate    Vitals:   09/22/17 2154 09/23/17 0140 09/23/17 0155 09/23/17 0600  BP:  (!) 154/75  (!) 150/71  Pulse:  68  72  Resp: 16 18 17 16   Temp:  98.3 F (36.8 C)  98.3 F (36.8 C)  TempSrc:  Oral  Oral  SpO2: 98% 97% 98% 96%  Weight:      Height:        NAD  Abdomen:  soft, appropriate tenderness, pressure bandage on--clean and dry ext:    Symmetric, SCDs on  Lab Results  Component Value Date   WBC 11.1 (H) 09/23/2017   HGB 8.5 (L) 09/23/2017   HCT 26.2 (L) 09/23/2017   MCV 82.6 09/23/2017   PLT 192 09/23/2017    --/--/AB POS (10/28 0550)/RImmune  A/P    41 y.o. [id number] POD 1 s/p RCS for PROM Routine post op and postpartum care.   BPs have been mildly elevated in the 140-150/70-80s since delivery.  CMP/CBC wnl.  Asymptomatic.  Will monitor closely and consider initiating oral antihypertensive if consistently >150 systolic or >90 diastolic PE diagnosed during this pregnancy--was restarted this AM on therapeutic lovenox 120mg  [place] daily.  Will continue for 6 weeks PP and plan f/u w heme as an outpatient

## 2017-09-23 NOTE — Progress Notes (Signed)
D/c'd PCA pump per MD order. Wasted dilaudid in sink and in pixis.  Dustin Folkseborah Nix, RN witnessed waste.

## 2017-09-24 LAB — GLUCOSE, CAPILLARY: GLUCOSE-CAPILLARY: 98 mg/dL (ref 65–99)

## 2017-09-24 MED ORDER — FERROUS SULFATE 325 (65 FE) MG PO TABS
325.0000 mg | ORAL_TABLET | Freq: Two times a day (BID) | ORAL | Status: DC
  Administered 2017-09-24 – 2017-09-25 (×2): 325 mg via ORAL
  Filled 2017-09-24 (×2): qty 1

## 2017-09-24 NOTE — Progress Notes (Addendum)
MD Claiborne Billingsallahan notified regarding BP. MD Requested patient lay down and recheck in 30mins.  Patient was moving around in room and needed to go to bathroom.  Waiting for patient to sit down and will recheck in 30mins.    Addend, I advised RN to recheck BP in 10 mins, not 30 min.  I advised her to call back with severe range.  I have now reviewed the repeat BP of 153/86

## 2017-09-24 NOTE — Progress Notes (Signed)
Patient is eating, ambulating, voiding.  Pain control is good.  Appropriate lochia, no complaints.  Vitals:   09/23/17 2209 09/24/17 0205 09/24/17 0555 09/24/17 0909  BP: 138/79 137/85 (!) 135/95 (!) 159/92  Pulse: 87 94 (!) 102 82  Resp: 20 20 18    Temp: 97.6 F (36.4 C) 98.4 F (36.9 C)    TempSrc: Oral Oral    SpO2:      Weight:      Height:        [employer]: c/d/i Ext: mild swelling bilaterally, no calf pain  Lab Results  Component Value Date   WBC 11.1 (H) 09/23/2017   HGB 8.5 (L) 09/23/2017   HCT 26.2 (L) 09/23/2017   MCV 82.6 09/23/2017   PLT 192 09/23/2017    --/--/AB POS (10/28 0550)  A/P Post op day 2 s/p repeat cs Daily Lovenox 120mg  [place]- pt has add'l supple at home and at our office.  Knows that this will need to be continued for 6 weeks postpartum.  Will call office when needs RF. BPs: last severe range 163/82 at 19:00 yesterday, normal and mild since, on Procardia XL 30mg  qd.  Will continue to monitor for add'l day. Anemia- FeSO4 added bid Tubal not performed at time of c/s.  Pt and husband frustrated, discussed options and will discuss further when pt follows up.  Routine care.   Philip AspenALLAHAN, Jeoffrey Eleazer

## 2017-09-25 ENCOUNTER — Encounter (HOSPITAL_COMMUNITY): Payer: Self-pay

## 2017-09-25 MED ORDER — OXYCODONE-ACETAMINOPHEN 5-325 MG PO TABS: 1.0000 | ORAL_TABLET | ORAL | 0 refills | Status: AC | PRN

## 2017-09-25 MED ORDER — NIFEDIPINE ER 30 MG PO TB24: 30.0000 mg | ORAL_TABLET | Freq: Every day | ORAL | 4 refills | Status: DC

## 2017-09-25 NOTE — Progress Notes (Signed)
  Patient is eating, ambulating, voiding.  Pain control is good.  Vitals:   09/24/17 1719 09/24/17 2015 09/25/17 0000 09/25/17 0400  BP: (!) 156/88 (!) 156/95 (!) 158/85 (!) 157/89  Pulse: (!) 104 85 85 85  Resp: 20 18 16 18   Temp: 98.4 F (36.9 C)     TempSrc: Oral     SpO2: 100%     Weight:      Height:        lungs:   clear to auscultation cor:    RRR Abdomen:  soft, appropriate tenderness, incisions intact and without erythema or exudate ex:    no cords   Lab Results  Component Value Date   WBC 11.1 (H) 09/23/2017   HGB 8.5 (L) 09/23/2017   HCT 26.2 (L) 09/23/2017   MCV 82.6 09/23/2017   PLT 192 09/23/2017    --/--/AB POS (10/28 0550)/RI  A/P    Post operative day 3.  Routine post op and postpartum care.  Expect d/c today.  Percocet for pain control. BPs controlled on Procardia.  F/u in 2 weeks for BP.  Pt to  continue on Lovenox for hx DVT.  Will need to discuss birth control and possible BTL in  office.

## 2017-09-25 NOTE — Progress Notes (Signed)
D/c instructions given & signed.  Prescriptions reviewed & given; pt verb understanding.  Denies ques/concerns at this time.

## 2017-09-25 NOTE — Discharge Summary (Signed)
Obstetric Discharge Summary Reason for Admission: rupture of membranes Prenatal Procedures: gestational hypertension, DVT Intrapartum Procedures: cesarean: low cervical, transverse Postpartum Procedures: none Complications-Operative and Postpartum: none Hemoglobin  Date Value Ref Range Status  09/23/2017 8.5 (L) 12.0 - 15.0 g/dL Final    Comment:    DELTA CHECK NOTED REPEATED TO VERIFY    HCT  Date Value Ref Range Status  09/23/2017 26.2 (L) 36.0 - 46.0 % Final    Discharge Diagnoses: Term Pregnancy-delivered  Discharge Information: Date: 09/25/2017 Activity: pelvic rest Diet: routine Medications: Iron, Percocet and Procardia, continue Lovenox for pp at least 6 weeks Condition: stable Instructions: refer to practice specific booklet Discharge to: home Follow-up Information    Waynard Reedsoss, Kendra, MD Follow up in 2 day(s).   Specialty:  Obstetrics and Gynecology Why:  BP check Contact information: [address] [place] [address] [phone]           Newborn Data: Live born female  Birth Weight: 7 lb 4.4 oz (3300 g) APGAR: 3, 7  Newborn Delivery   Birth date/time:  09/22/2017 08:35:00 Delivery type:  C-Section, Vacuum Assisted  C-section categorization:  Repeat     Home with mother.  Sydney Holloway 09/25/2017, 7:52 AM

## 2017-09-26 ENCOUNTER — Encounter (HOSPITAL_COMMUNITY): Payer: Self-pay | Admitting: *Deleted

## 2017-09-26 ENCOUNTER — Encounter (HOSPITAL_COMMUNITY)
Admission: RE | Admit: 2017-09-26 | Discharge: 2017-09-26 | Disposition: A | Discharge status: Home / Self Care | Payer: 59 | Source: Ambulatory Visit

## 2017-09-26 ENCOUNTER — Inpatient Hospital Stay (HOSPITAL_COMMUNITY)
Admission: AD | Admit: 2017-09-26 | Discharge: 2017-10-01 | DRG: 776 | Disposition: A | Discharge status: Home / Self Care | Payer: 59 | Source: Ambulatory Visit | Attending: Obstetrics and Gynecology | Admitting: Obstetrics and Gynecology

## 2017-09-26 DIAGNOSIS — O1495 Unspecified pre-eclampsia, complicating the puerperium: Secondary | ICD-10-CM | POA: Diagnosis present

## 2017-09-26 DIAGNOSIS — Z86718 Personal history of other venous thrombosis and embolism: Secondary | ICD-10-CM

## 2017-09-26 DIAGNOSIS — Z86711 Personal history of pulmonary embolism: Secondary | ICD-10-CM

## 2017-09-26 DIAGNOSIS — O1093 Unspecified pre-existing hypertension complicating the puerperium: Secondary | ICD-10-CM | POA: Diagnosis not present

## 2017-09-26 DIAGNOSIS — Z7902 Long term (current) use of antithrombotics/antiplatelets: Secondary | ICD-10-CM

## 2017-09-26 DIAGNOSIS — R030 Elevated blood-pressure reading, without diagnosis of hypertension: Secondary | ICD-10-CM | POA: Diagnosis present

## 2017-09-26 HISTORY — DX: Deep phlebothrombosis in pregnancy, unspecified trimester: O22.30

## 2017-09-26 HISTORY — DX: Supervision of elderly multigravida, unspecified trimester: O09.529

## 2017-09-26 HISTORY — DX: Anemia, unspecified: D64.9

## 2017-09-26 LAB — TYPE AND SCREEN
ABO/RH(D): AB POS
ANTIBODY SCREEN: NEGATIVE
UNIT DIVISION: 0
UNIT DIVISION: 0
UNIT DIVISION: 0
Unit division: 0
Unit division: 0

## 2017-09-26 LAB — PROTEIN / CREATININE RATIO, URINE
CREATININE, URINE: 24 mg/dL
PROTEIN CREATININE RATIO: 2.54 mg/mg{creat} — AB (ref 0.00–0.15)
TOTAL PROTEIN, URINE: 61 mg/dL

## 2017-09-26 LAB — BPAM RBC
BLOOD PRODUCT EXPIRATION DATE: 201811112359
BLOOD PRODUCT EXPIRATION DATE: 201811202359
Blood Product Expiration Date: 201811102359
Blood Product Expiration Date: 201811112359
Blood Product Expiration Date: 201811112359
ISSUE DATE / TIME: 201810312159
ISSUE DATE / TIME: 201811010056
ISSUE DATE / TIME: 201810312246
UNIT TYPE AND RH: 6200
UNIT TYPE AND RH: 6200
Unit Type and Rh: 6200
Unit Type and Rh: 6200
Unit Type and Rh: 6200

## 2017-09-26 LAB — URINALYSIS, ROUTINE W REFLEX MICROSCOPIC
Bacteria, UA: NONE SEEN
Bilirubin Urine: NEGATIVE
GLUCOSE, UA: NEGATIVE mg/dL
Ketones, ur: NEGATIVE mg/dL
Nitrite: NEGATIVE
PH: 8 (ref 5.0–8.0)
Protein, ur: 100 mg/dL — AB
Specific Gravity, Urine: 1.008 (ref 1.005–1.030)

## 2017-09-26 LAB — CBC
HCT: 31.8 % — ABNORMAL LOW (ref 36.0–46.0)
Hemoglobin: 10.4 g/dL — ABNORMAL LOW (ref 12.0–15.0)
MCH: 26.9 pg (ref 26.0–34.0)
MCHC: 32.7 g/dL (ref 30.0–36.0)
MCV: 82.2 fL (ref 78.0–100.0)
PLATELETS: 266 10*3/uL (ref 150–400)
RBC: 3.87 MIL/uL (ref 3.87–5.11)
RDW: 14.9 % (ref 11.5–15.5)
WBC: 8.4 10*3/uL (ref 4.0–10.5)

## 2017-09-26 MED ORDER — ONDANSETRON HCL 4 MG/2ML IJ SOLN
4.0000 mg | Freq: Four times a day (QID) | INTRAMUSCULAR | Status: DC | PRN
Start: 2017-09-26 — End: 2017-10-01
  Administered 2017-09-26: 4 mg via INTRAVENOUS
  Filled 2017-09-26: qty 2

## 2017-09-26 MED ORDER — ONDANSETRON HCL 4 MG PO TABS: 4.0000 mg | ORAL_TABLET | Freq: Four times a day (QID) | ORAL | Status: DC | PRN

## 2017-09-26 MED ORDER — HYDRALAZINE HCL 20 MG/ML IJ SOLN
10.0000 mg | Freq: Once | INTRAMUSCULAR | Status: AC | PRN
  Administered 2017-09-28: 10 mg via INTRAVENOUS
  Filled 2017-09-26 (×2): qty 1

## 2017-09-26 MED ORDER — IBUPROFEN 600 MG PO TABS
600.0000 mg | ORAL_TABLET | Freq: Four times a day (QID) | ORAL | Status: DC | PRN
  Administered 2017-09-26: 600 mg via ORAL
  Filled 2017-09-26: qty 1

## 2017-09-26 MED ORDER — MAGNESIUM SULFATE BOLUS VIA INFUSION
4.0000 g | Freq: Once | INTRAVENOUS | Status: AC
  Administered 2017-09-26: 4 g via INTRAVENOUS
  Filled 2017-09-26: qty 500

## 2017-09-26 MED ORDER — LACTATED RINGERS IV SOLN
INTRAVENOUS | Status: DC
  Administered 2017-09-26: 25 mL via INTRAVENOUS
  Administered 2017-09-27 (×2): via INTRAVENOUS

## 2017-09-26 MED ORDER — PRENATAL MULTIVITAMIN CH
1.0000 | ORAL_TABLET | Freq: Every day | ORAL | Status: DC
  Administered 2017-09-27 – 2017-09-29 (×3): 1 via ORAL
  Filled 2017-09-26 (×3): qty 1

## 2017-09-26 MED ORDER — MAGNESIUM SULFATE 40 G IN LACTATED RINGERS - SIMPLE
2.0000 g/h | INTRAVENOUS | Status: AC
  Administered 2017-09-27: 2 g/h via INTRAVENOUS
  Filled 2017-09-26: qty 40
  Filled 2017-09-26: qty 500

## 2017-09-26 MED ORDER — HYDRALAZINE HCL 20 MG/ML IJ SOLN
10.0000 mg | Freq: Once | INTRAMUSCULAR | Status: AC | PRN
  Administered 2017-09-28: 10 mg via INTRAVENOUS

## 2017-09-26 MED ORDER — OXYCODONE-ACETAMINOPHEN 5-325 MG PO TABS: 1.0000 | ORAL_TABLET | ORAL | Status: DC | PRN

## 2017-09-26 MED ORDER — ENOXAPARIN SODIUM 120 MG/0.8ML ~~LOC~~ SOLN
120.0000 mg | SUBCUTANEOUS | Status: DC
  Administered 2017-09-26 – 2017-09-30 (×5): 120 mg via SUBCUTANEOUS
  Filled 2017-09-26 (×5): qty 0.8

## 2017-09-26 MED ORDER — LABETALOL HCL 200 MG PO TABS
200.0000 mg | ORAL_TABLET | Freq: Two times a day (BID) | ORAL | Status: DC
  Administered 2017-09-27 – 2017-09-28 (×4): 200 mg via ORAL
  Filled 2017-09-26 (×4): qty 1

## 2017-09-26 MED ORDER — BUTALBITAL-APAP-CAFFEINE 50-325-40 MG PO TABS
2.0000 | ORAL_TABLET | Freq: Once | ORAL | Status: AC
  Administered 2017-09-26: 2 via ORAL
  Filled 2017-09-26: qty 2

## 2017-09-26 MED ORDER — LABETALOL HCL 5 MG/ML IV SOLN
20.0000 mg | INTRAVENOUS | Status: AC | PRN
  Administered 2017-09-30: 40 mg via INTRAVENOUS
  Administered 2017-09-30: 80 mg via INTRAVENOUS
  Administered 2017-09-30: 20 mg via INTRAVENOUS
  Filled 2017-09-26: qty 4
  Filled 2017-09-26 (×2): qty 8
  Filled 2017-09-26: qty 4
  Filled 2017-09-26: qty 16
  Filled 2017-09-26: qty 4

## 2017-09-26 MED ORDER — LABETALOL HCL 5 MG/ML IV SOLN
20.0000 mg | INTRAVENOUS | Status: AC | PRN
  Administered 2017-09-27: 20 mg via INTRAVENOUS
  Administered 2017-09-28: 40 mg via INTRAVENOUS
  Administered 2017-09-28: 20 mg via INTRAVENOUS

## 2017-09-26 MED ORDER — BUTALBITAL-APAP-CAFFEINE 50-325-40 MG PO TABS
1.0000 | ORAL_TABLET | ORAL | Status: DC | PRN
  Administered 2017-09-26 – 2017-09-30 (×4): 2 via ORAL
  Filled 2017-09-26 (×4): qty 2

## 2017-09-26 MED ORDER — SENNOSIDES-DOCUSATE SODIUM 8.6-50 MG PO TABS
1.0000 | ORAL_TABLET | Freq: Every evening | ORAL | Status: DC | PRN
  Administered 2017-09-27: 1 via ORAL
  Filled 2017-09-26: qty 1

## 2017-09-26 NOTE — H&P (Signed)
Sydney Holloway is a 41 y.o. female presenting for elevated blood pressure  41 yo G3P3003 POD #4 presenting to the office for evaluation of blood pressure in the context of a severe headache. She was discharged home on procardia 1 day before. At the time of discharge she had a mild headache that had improved with tylenol. Today she has had a severe headache that has not responded to medication. She describes the headache as a migraine. Her blood pressure in the office was 167/97. She had taken her procardia earlier int he day. Her CBC was normal but her CMP hemolyzed and could not be run.    OB History    Gravida Para Term Preterm AB Living   3 3 2 1   3    SAB TAB Ectopic Multiple Live Births         0 3     Past Medical History:  Diagnosis Date  . AMA (advanced maternal age) multigravida 35+   . Anemia   . DVT (deep vein thrombosis) in pregnancy (HCC) 02/28/2017   l leg  . DVT of leg (deep venous thrombosis) (HCC)    right  . Pulmonary embolus (HCC)    bilateral lungs   Past Surgical History:  Procedure Laterality Date  . CESAREAN SECTION    . CESAREAN SECTION N/A 09/22/2017   Procedure: CESAREAN SECTION;  Surgeon: Waynard Reedsoss, Jernee Murtaugh, MD;  [facility];  Service: Obstetrics;  Laterality: N/A;  . KNEE SURGERY     Family History: family history includes Bell's palsy in her mother; Breast cancer in her father; Clotting disorder in her maternal aunt; Diabetes in her father, maternal aunt, mother, and paternal grandmother; Kidney disease in her father. Social History:  reports that she has never smoked. She has never used smokeless tobacco. She reports that she does not drink alcohol or use drugs.       ROS: See above History   Blood pressure (!) 152/88, pulse (!) 106, temperature 99.2 F (37.3 C), temperature source Oral, resp. rate 17, unknown if currently breastfeeding. Exam Physical Exam  Prenatal labs: ABO, Rh: --/--/AB POS (10/28 0550) Antibody: NEG (10/28  0527) Rubella: Immune (04/20 0000) RPR: Non Reactive (10/28 0526)  HBsAg: Negative (04/20 0000)  HIV: Non-reactive (04/20 0000)  GBS: Positive (10/05 0000)   Assessment/Plan: 1) Given severe range BPs and headache the patient meets criteria for pre-eclampsia. Will admit for magnesium sulfate for seizure prophylaxis.  2) The patient had a DVT early in the pregnancy and is on therapeutic lovenox 120mg  daily 3) Will also start labetalol 200mg  po BID 4) Will attempt CMP & mag level after some IVF hydration   Sydney Holloway. 09/26/2017, 7:24 PM

## 2017-09-26 NOTE — MAU Provider Note (Signed)
History     CSN: [id number]  Arrival date and time: 09/26/17 1606   First Provider Initiated Contact with Patient 09/26/17 1653      Chief Complaint  Patient presents with  . Headache  . Pre-Eclampsia   HPI  Sydney Holloway is a 41 y.o. [phone] who presents from the office for BP evaluation. Patient is s/p cesarean section on Sunday; was discharged from the hospital yesterday. Was in office today with severe headache & severe range BP. Patient denies history of hypertension prior to this week when she was discharged home on procardia. Had dull headache yesterday prior to discharge that resolved. Headache returned today. Rates pain 9/10. Has not treated headache. Denies visual disturbance or epigastric pain.  OB History    Gravida Para Term Preterm AB Living   3 3 2 1   3    SAB TAB Ectopic Multiple Live Births         0 3      Past Medical History:  Diagnosis Date  . AMA (advanced maternal age) multigravida 35+   . Anemia   . DVT (deep vein thrombosis) in pregnancy (HCC) 02/28/2017   l leg  . DVT of leg (deep venous thrombosis) (HCC)    right  . Pulmonary embolus (HCC)    bilateral lungs    Past Surgical History:  Procedure Laterality Date  . CESAREAN SECTION    . CESAREAN SECTION N/A 09/22/2017   Procedure: CESAREAN SECTION;  Surgeon: Waynard Reedsoss, Kendra, MD;  [facility];  Service: Obstetrics;  Laterality: N/A;  . KNEE SURGERY      Family History  Problem Relation Age of Onset  . Diabetes Mother   . Bell's palsy Mother   . Diabetes Father   . Breast cancer Father   . Kidney disease Father        transplant  . Diabetes Maternal Aunt   . Clotting disorder Maternal Aunt   . Diabetes Paternal Grandmother     Social History  Substance Use Topics  . Smoking status: Never Smoker  . Smokeless tobacco: Never Used  . Alcohol use No    Allergies: No Known Allergies  Prescriptions Prior to Admission  Medication Sig Dispense Refill Last Dose  .  enoxaparin (LOVENOX) 120 MG/0.8ML injection Inject 0.8 mLs (120 mg total) into the skin daily. 30 Syringe 0 09/21/2017 at 2000  . IRON PO Take 1 tablet by mouth daily.   09/21/2017 at Unknown time  . NIFEdipine (PROCARDIA-XL/ADALAT CC) 30 MG 24 hr tablet Take 1 tablet (30 mg total) by mouth daily. 90 tablet 4   . oxyCODONE-acetaminophen (PERCOCET/ROXICET) 5-325 MG tablet Take 1 tablet by mouth every 4 (four) hours as needed (pain scale 4-7). 30 tablet 0   . Prenatal Vit-Fe Fumarate-FA (PRENATAL MULTIVITAMIN) TABS tablet Take 1 tablet by mouth daily at 12 noon.   09/21/2017 at Unknown time    Review of Systems  Constitutional: Negative.   Eyes: Negative for photophobia and visual disturbance.  Respiratory: Negative for shortness of breath.   Cardiovascular: Negative for chest pain.  Gastrointestinal: Positive for abdominal pain (reports continued soreness at site of incision).  Genitourinary: Negative.   Neurological: Positive for headaches. Negative for dizziness.   Physical Exam   Blood pressure (!) 151/87, pulse (!) 123, temperature 99.2 F (37.3 C), temperature source Oral, resp. rate 17, unknown if currently breastfeeding.  Patient Vitals for the past 24 hrs:  BP Temp Temp src Pulse Resp  09/26/17 1800 Marland Kitchen(!)  151/87 - - (!) 123 -  09/26/17 1750 (!) 155/82 99.2 F (37.3 C) Oral (!) 125 -  09/26/17 1730 (!) 152/88 - - (!) 120 -  09/26/17 1700 (!) 157/90 - - (!) 115 -  09/26/17 1651 (!) 152/93 - - (!) 115 -  09/26/17 1620 (!) 154/99 99 F (37.2 C) Oral (!) 110 17    Physical Exam  Nursing note and vitals reviewed. Constitutional: She is oriented to person, place, and time. She appears well-developed and well-nourished. No distress.  HENT:  Head: Normocephalic and atraumatic.  Eyes: Conjunctivae are normal. Right eye exhibits no discharge. Left eye exhibits no discharge. No scleral icterus.  Neck: Normal range of motion.  Cardiovascular: Normal rate, regular rhythm and normal  heart sounds.   No murmur heard. Respiratory: Effort normal and breath sounds normal. No respiratory distress. She has no wheezes.  GI: Soft. There is no tenderness.  Musculoskeletal: She exhibits edema (BLE, 2+ pitting edema).  Neurological: She is alert and oriented to person, place, and time. She has normal reflexes.  No clonus  Skin: Skin is warm and dry. She is not diaphoretic.  Psychiatric: She has a normal mood and affect. Her behavior is normal. Judgment and thought content normal.    MAU Course  Procedures Results for orders placed or performed during the hospital encounter of 09/26/17 (from the past 24 hour(s))  Protein / creatinine ratio, urine     Status: Abnormal   Collection Time: 09/26/17  4:10 PM  Result Value Ref Range   Creatinine, Urine 24.00 mg/dL   Total Protein, Urine 61 mg/dL   Protein Creatinine Ratio 2.54 (H) 0.00 - 0.15 mg/mg[Cre]  Urinalysis, Routine w reflex microscopic     Status: Abnormal   Collection Time: 09/26/17  4:10 PM  Result Value Ref Range   Color, Urine STRAW (A) YELLOW   APPearance CLEAR CLEAR   Specific Gravity, Urine 1.008 1.005 - 1.030   pH 8.0 5.0 - 8.0   Glucose, UA NEGATIVE NEGATIVE mg/dL   Hgb urine dipstick MODERATE (A) NEGATIVE   Bilirubin Urine NEGATIVE NEGATIVE   Ketones, ur NEGATIVE NEGATIVE mg/dL   Protein, ur 409 (A) NEGATIVE mg/dL   Nitrite NEGATIVE NEGATIVE   Leukocytes, UA TRACE (A) NEGATIVE   RBC / HPF 6-30 0 - 5 RBC/hpf   WBC, UA 6-30 0 - 5 WBC/hpf   Bacteria, UA NONE SEEN NONE SEEN   Squamous Epithelial / LPF 0-5 (A) NONE SEEN   Mucus PRESENT   CBC     Status: Abnormal   Collection Time: 09/26/17  5:31 PM  Result Value Ref Range   WBC 8.4 4.0 - 10.5 K/uL   RBC 3.87 3.87 - 5.11 MIL/uL   Hemoglobin 10.4 (L) 12.0 - 15.0 g/dL   HCT 81.1 (L) 91.4 - 78.2 %   MCV 82.2 78.0 - 100.0 fL   MCH 26.9 26.0 - 34.0 pg   MCHC 32.7 30.0 - 36.0 g/dL   RDW 95.6 21.3 - 08.6 %   Platelets 266 150 - 400 K/uL    MDM Cycle BPs  -- none severe range Patient is a very difficult stick -- initially unable to establish IV access or obtain labs. Eventually labs were collected but per lab unable to run CMP d/t hemolyzed sample.  Cath u/a ordered -- CC u/a was collected Fioricet 2 tabs given for headache with pt reporting improvement C/w Dr. Tenny Craw regarding assessment & labs. Will come speak with patient regarding POC.  Assessment and Plan  A: Postpartum preeclampsia  P: Dr. Tenny Craw to admit patient   Judeth Horn 09/26/2017, 4:53 PM

## 2017-09-26 NOTE — MAU Note (Signed)
[facility]  Pt. To be admitted to [facility]. Stable patient.

## 2017-09-26 NOTE — MAU Note (Signed)
Post-op patient x5 days, Repeat c-section on Sunday at 0843 a.m.  Today patient present with headache that started yesterday around 2-3 p.m.  Headache 9/10, sharp.

## 2017-09-27 ENCOUNTER — Encounter (HOSPITAL_COMMUNITY): Payer: Self-pay

## 2017-09-27 ENCOUNTER — Inpatient Hospital Stay (HOSPITAL_COMMUNITY): Admit: 2017-09-27 | Payer: 59 | Admitting: Obstetrics and Gynecology

## 2017-09-27 LAB — CBC
HEMATOCRIT: 29 % — AB (ref 36.0–46.0)
HEMOGLOBIN: 9.6 g/dL — AB (ref 12.0–15.0)
MCH: 26.7 pg (ref 26.0–34.0)
MCHC: 33.1 g/dL (ref 30.0–36.0)
MCV: 80.8 fL (ref 78.0–100.0)
Platelets: 303 10*3/uL (ref 150–400)
RBC: 3.59 MIL/uL — AB (ref 3.87–5.11)
RDW: 14.7 % (ref 11.5–15.5)
WBC: 6.6 10*3/uL (ref 4.0–10.5)

## 2017-09-27 LAB — COMPREHENSIVE METABOLIC PANEL
ALBUMIN: 2.7 g/dL — AB (ref 3.5–5.0)
ALT: 15 U/L (ref 14–54)
ANION GAP: 9 (ref 5–15)
AST: 17 U/L (ref 15–41)
Alkaline Phosphatase: 63 U/L (ref 38–126)
BUN: 8 mg/dL (ref 6–20)
CHLORIDE: 104 mmol/L (ref 101–111)
CO2: 23 mmol/L (ref 22–32)
Calcium: 7.6 mg/dL — ABNORMAL LOW (ref 8.9–10.3)
Creatinine, Ser: 0.73 mg/dL (ref 0.44–1.00)
GFR calc Af Amer: >60 mL/min (ref >60)
GFR calc non Af Amer: >60 mL/min (ref >60)
GLUCOSE: 108 mg/dL — AB (ref 65–99)
POTASSIUM: 3.3 mmol/L — AB (ref 3.5–5.1)
SODIUM: 136 mmol/L (ref 135–145)
TOTAL PROTEIN: 6.2 g/dL — AB (ref 6.5–8.1)
Total Bilirubin: 0.4 mg/dL (ref 0.3–1.2)

## 2017-09-27 LAB — MAGNESIUM: Magnesium: 4.9 mg/dL — ABNORMAL HIGH (ref 1.7–2.4)

## 2017-09-27 NOTE — Progress Notes (Signed)
  Patient is eating, ambulating, voiding.  Pain control is good.  Headache is better.    Vitals:   09/27/17 0100 09/27/17 0200 09/27/17 0300 09/27/17 0400  BP: (!) 158/89 (!) 140/91 133/87 139/86  Pulse: 84 79 79 78  Resp: 18 16 16 16   Temp:      TempSrc:      SpO2: 98% 98% 99% 98%  Weight:      Height:        lungs:  clear to auscultation cor:    RRR Abdomen:  soft, appropriate tenderness, incisions intact and without erythema or exudate ex:    no cords   Lab Results  Component Value Date   WBC 6.6 09/27/2017   HGB 9.6 (L) 09/27/2017   HCT 29.0 (L) 09/27/2017   MCV 80.8 09/27/2017   PLT 303 09/27/2017      A/P    Post operative day #5 C/S with postpartum preeclampsia with headache as severe feature.    1.  Will get CMET this am.  2.  Percocet for pain control.   3.  Magnesium for 24 hours.   4.  On labetalol bid now for blood pressure control- BPS controlled now.  5.  On lovenox therapeutic for DVT.

## 2017-09-27 NOTE — Progress Notes (Signed)
Results for orders placed or performed during the hospital encounter of 09/26/17 (from the past 24 hour(s))  Protein / creatinine ratio, urine     Status: Abnormal   Collection Time: 09/26/17  4:10 PM  Result Value Ref Range   Creatinine, Urine 24.00 mg/dL   Total Protein, Urine 61 mg/dL   Protein Creatinine Ratio 2.54 (H) 0.00 - 0.15 mg/mg[Cre]  Urinalysis, Routine w reflex microscopic     Status: Abnormal   Collection Time: 09/26/17  4:10 PM  Result Value Ref Range   Color, Urine STRAW (A) YELLOW   APPearance CLEAR CLEAR   Specific Gravity, Urine 1.008 1.005 - 1.030   pH 8.0 5.0 - 8.0   Glucose, UA NEGATIVE NEGATIVE mg/dL   Hgb urine dipstick MODERATE (A) NEGATIVE   Bilirubin Urine NEGATIVE NEGATIVE   Ketones, ur NEGATIVE NEGATIVE mg/dL   Protein, ur 161100 (A) NEGATIVE mg/dL   Nitrite NEGATIVE NEGATIVE   Leukocytes, UA TRACE (A) NEGATIVE   RBC / HPF 6-30 0 - 5 RBC/hpf   WBC, UA 6-30 0 - 5 WBC/hpf   Bacteria, UA NONE SEEN NONE SEEN   Squamous Epithelial / LPF 0-5 (A) NONE SEEN   Mucus PRESENT   CBC     Status: Abnormal   Collection Time: 09/26/17  5:31 PM  Result Value Ref Range   WBC 8.4 4.0 - 10.5 K/uL   RBC 3.87 3.87 - 5.11 MIL/uL   Hemoglobin 10.4 (L) 12.0 - 15.0 g/dL   HCT 09.631.8 (L) 04.536.0 - 40.946.0 %   MCV 82.2 78.0 - 100.0 fL   MCH 26.9 26.0 - 34.0 pg   MCHC 32.7 30.0 - 36.0 g/dL   RDW 81.114.9 91.411.5 - 78.215.5 %   Platelets 266 150 - 400 K/uL  Comprehensive metabolic panel     Status: Abnormal   Collection Time: 09/27/17  5:09 AM  Result Value Ref Range   Sodium 136 135 - 145 mmol/L   Potassium 3.3 (L) 3.5 - 5.1 mmol/L   Chloride 104 101 - 111 mmol/L   CO2 23 22 - 32 mmol/L   Glucose, Bld 108 (H) 65 - 99 mg/dL   BUN 8 6 - 20 mg/dL   Creatinine, Ser 9.560.73 0.44 - 1.00 mg/dL   Calcium 7.6 (L) 8.9 - 10.3 mg/dL   Total Protein 6.2 (L) 6.5 - 8.1 g/dL   Albumin 2.7 (L) 3.5 - 5.0 g/dL   AST 17 15 - 41 U/L   ALT 15 14 - 54 U/L   Alkaline Phosphatase 63 38 - 126 U/L   Total  Bilirubin 0.4 0.3 - 1.2 mg/dL   GFR calc non Af Amer >60 >60 mL/min   GFR calc Af Amer >60 >60 mL/min   Anion gap 9 5 - 15  Magnesium     Status: Abnormal   Collection Time: 09/27/17  5:09 AM  Result Value Ref Range   Magnesium 4.9 (H) 1.7 - 2.4 mg/dL  CBC     Status: Abnormal   Collection Time: 09/27/17  5:09 AM  Result Value Ref Range   WBC 6.6 4.0 - 10.5 K/uL   RBC 3.59 (L) 3.87 - 5.11 MIL/uL   Hemoglobin 9.6 (L) 12.0 - 15.0 g/dL   HCT 21.329.0 (L) 08.636.0 - 57.846.0 %   MCV 80.8 78.0 - 100.0 fL   MCH 26.7 26.0 - 34.0 pg   MCHC 33.1 30.0 - 36.0 g/dL   RDW 46.914.7 62.911.5 - 52.815.5 %   Platelets 303 150 -  400 K/uL     Labs are normal.  Proteinuria at 1+.

## 2017-09-28 LAB — CBC
HCT: 25.4 % — ABNORMAL LOW (ref 36.0–46.0)
HCT: 28.7 % — ABNORMAL LOW (ref 36.0–46.0)
HEMOGLOBIN: 8.5 g/dL — AB (ref 12.0–15.0)
Hemoglobin: 9.4 g/dL — ABNORMAL LOW (ref 12.0–15.0)
MCH: 27.1 pg (ref 26.0–34.0)
MCH: 26.7 pg (ref 26.0–34.0)
MCHC: 33.5 g/dL (ref 30.0–36.0)
MCHC: 32.8 g/dL (ref 30.0–36.0)
MCV: 80.9 fL (ref 78.0–100.0)
MCV: 81.5 fL (ref 78.0–100.0)
Platelets: DECREASED 10*3/uL (ref 150–400)
Platelets: 302 10*3/uL (ref 150–400)
RBC: 3.14 MIL/uL — ABNORMAL LOW (ref 3.87–5.11)
RBC: 3.52 MIL/uL — AB (ref 3.87–5.11)
RDW: 14.7 % (ref 11.5–15.5)
RDW: 14.6 % (ref 11.5–15.5)
WBC: 3.2 10*3/uL — ABNORMAL LOW (ref 4.0–10.5)
WBC: 7.1 10*3/uL (ref 4.0–10.5)

## 2017-09-28 LAB — COMPREHENSIVE METABOLIC PANEL
ALK PHOS: 70 U/L (ref 38–126)
ALT: 13 U/L — ABNORMAL LOW (ref 14–54)
ANION GAP: 9 (ref 5–15)
AST: 24 U/L (ref 15–41)
Albumin: 2.6 g/dL — ABNORMAL LOW (ref 3.5–5.0)
BUN: 9 mg/dL (ref 6–20)
CALCIUM: 7.5 mg/dL — AB (ref 8.9–10.3)
CO2: 22 mmol/L (ref 22–32)
Chloride: 104 mmol/L (ref 101–111)
Creatinine, Ser: 0.74 mg/dL (ref 0.44–1.00)
GFR calc non Af Amer: >60 mL/min (ref >60)
Glucose, Bld: 90 mg/dL (ref 65–99)
Potassium: 4.5 mmol/L (ref 3.5–5.1)
SODIUM: 135 mmol/L (ref 135–145)
TOTAL PROTEIN: 5.9 g/dL — AB (ref 6.5–8.1)
Total Bilirubin: 0.8 mg/dL (ref 0.3–1.2)

## 2017-09-28 MED ORDER — NIFEDIPINE ER OSMOTIC RELEASE 30 MG PO TB24
30.0000 mg | ORAL_TABLET | Freq: Every day | ORAL | Status: DC
  Administered 2017-09-28: 30 mg via ORAL
  Filled 2017-09-28: qty 1

## 2017-09-28 MED ORDER — NIFEDIPINE ER OSMOTIC RELEASE 30 MG PO TB24
30.0000 mg | ORAL_TABLET | Freq: Two times a day (BID) | ORAL | Status: DC
  Administered 2017-09-28: 30 mg via ORAL
  Filled 2017-09-28: qty 1

## 2017-09-28 MED ORDER — FERROUS SULFATE 325 (65 FE) MG PO TABS
325.0000 mg | ORAL_TABLET | Freq: Every day | ORAL | Status: DC
  Administered 2017-09-28 – 2017-09-30 (×3): 325 mg via ORAL
  Filled 2017-09-28 (×3): qty 1

## 2017-09-28 MED ORDER — LABETALOL HCL 200 MG PO TABS
200.0000 mg | ORAL_TABLET | Freq: Three times a day (TID) | ORAL | Status: DC
  Administered 2017-09-28 – 2017-09-29 (×3): 200 mg via ORAL
  Filled 2017-09-28 (×3): qty 1

## 2017-09-28 NOTE — Progress Notes (Signed)
On review of chart severe range BPs noted overnight.  Spoke with RN about frequency of BP checking in light of severe range.  Advised to start Procardia XL 30mg  qd.  Recheck BP now and again in 30 min if severe range..Marland Kitchen

## 2017-09-28 NOTE — Progress Notes (Addendum)
Patient is eating, ambulating, voiding.  Pain control is good.  She denies HA, vision change or RUQ pain.  Vaginal bleeding is light.  Vitals:   09/28/17 0800 09/28/17 0827 09/28/17 0914 09/28/17 0915  BP: (!) 181/95 (!) 171/75 (!) 180/81 (!) 170/90  Pulse: 79 72    Resp: 16 16    Temp: 98.3 F (36.8 C)     TempSrc: Oral     SpO2: 100%     Weight:      Height:       Gen: NAD   Lab Results  Component Value Date   WBC 6.6 09/27/2017   HGB 9.6 (L) 09/27/2017   HCT 29.0 (L) 09/27/2017   MCV 80.8 09/27/2017   PLT 303 09/27/2017    --/--/AB POS (10/28 0550)  A/P Post op day #6 s/p Repeat c/s and HD#3 readmission for pp preeclampsia S/p 24 hrs magnesium sulfate Pt initially started on labetalol 200bid.  Bps not well controlled overnight, Procardia XL 30mg  started (was on at home) and labetalol increased to 200tid BP protocol with IV antihypertensives started.  Discussed with Rn and will administer hydralazine IV to start and check BPs every 15 min until normal to mild range.  If persistently severe will continue IV protocol and consider increasing procardia and/or labetalol.  Repeat CBC and CMP ordered.  Other routine care.    Philip AspenALLAHAN, Erskin Zinda

## 2017-09-28 NOTE — Progress Notes (Signed)
   09/28/17 0955  Vital Signs  BP (!) 160/89  BP Location Right Arm  Patient Position (if appropriate) Semi-fowlers  BP Method Automatic  Pulse Rate 87  Pulse Rate Source Dinamap  Resp 18  Oxygen Therapy  SpO2 100 %  O2 Device Room Air  Dr. Claiborne Billingsallahan on unit and made aware of pts. BP which is 10 minutes after Hydralazine 10mg .

## 2017-09-29 ENCOUNTER — Other Ambulatory Visit: Payer: Self-pay

## 2017-09-29 MED ORDER — LABETALOL HCL 200 MG PO TABS
300.0000 mg | ORAL_TABLET | Freq: Three times a day (TID) | ORAL | Status: DC
  Administered 2017-09-29 (×2): 300 mg via ORAL
  Filled 2017-09-29 (×2): qty 1

## 2017-09-29 MED ORDER — NIFEDIPINE ER OSMOTIC RELEASE 30 MG PO TB24
60.0000 mg | ORAL_TABLET | Freq: Every day | ORAL | Status: DC
  Administered 2017-09-29 – 2017-09-30 (×2): 60 mg via ORAL
  Filled 2017-09-29 (×2): qty 2

## 2017-09-29 NOTE — Progress Notes (Signed)
Dr. Dareen PianoAnderson notified of patients BP's.  Orders received and will continue to monitor.

## 2017-09-29 NOTE — Plan of Care (Deleted)
Interpretor to bedside at beginning of shift to discus POC with pt. Pt. Encouraged to let RN know if she needs interpretor at any other point during shift. POC discussed and pt. In agreement. Psychosocial questions asked, pt. Reports feeling good of mood with a smile. Will monitor. Sitter at the bedside.

## 2017-09-30 MED ORDER — LABETALOL HCL 200 MG PO TABS
600.0000 mg | ORAL_TABLET | Freq: Three times a day (TID) | ORAL | Status: DC
  Administered 2017-09-30 (×3): 600 mg via ORAL
  Filled 2017-09-30 (×3): qty 3

## 2017-09-30 NOTE — Progress Notes (Signed)
Pt medicated with 20mg  labetalol IV for BP 184/94. Will re-evaluate. Carmelina DaneERRI L Keren Alverio, RN

## 2017-09-30 NOTE — Progress Notes (Signed)
Patient ID: Sydney Holloway, female   DOB: May 25, 1976, 41 y.o.   MRN: [id number]   HD#5 Readmission for PP hypertension/Preeclampsia  S: Denies HA/vision changes. Sad because wishes to be with her newborn. Family is reluctant to bring baby to hospital for patient to see her. O:  Vitals:   09/30/17 0833 09/30/17 0843 09/30/17 0853 09/30/17 1204  BP: (!) 183/85 (!) 175/89 (!) 168/89 (!) 175/89  Pulse:    73  Resp:    16  Temp:    98.5 F (36.9 C)  TempSrc:    Oral  SpO2:    93%  Weight:      Height:       AOX3, NAD Abd soft  A/P 1) PP pre-eclampsia: Labetalol increased form 300mg  TID to 600mg  TID today. Pt refused IV labetalol overnight because she feared it was keeping her in the hospital. Procardia XL 60mg . Systolic BPS all still severe range. Advised patient that if we can get her SBP to 160 or less and DBP 90 or less that we can discharge her home.

## 2017-09-30 NOTE — Progress Notes (Signed)
 80mg  Labetalol IV given. BP 175/89. Pt denies having a headache. Will continue to monitor. Carmelina DaneERRI L Tamara Monteith, RN

## 2017-09-30 NOTE — Plan of Care (Signed)
Pt. With BP of 176/88. Call to Dr. Dareen PianoAnderson. Pt. Has PRN labetalol orders. Pt. Asked me to notify MD prior to initiation of PRNs as she was told she should not receive any more in order to be released from the hospital. BP must be controlled on PO.  Instructed from Dr. Dareen PianoAnderson, not to initiate PRNs and to recheck BP in one hour. Ok not to call if pt's pressure = 160/90 or less.

## 2017-09-30 NOTE — Progress Notes (Signed)
Contact with Dr. Tenny Crawoss in regards to BPs 181/94, 176/84. New orders given. Carmelina DaneERRI L Caffie Sotto, RN

## 2017-10-01 MED ORDER — LABETALOL HCL 300 MG PO TABS: 600.0000 mg | ORAL_TABLET | Freq: Three times a day (TID) | ORAL | 4 refills | Status: AC

## 2017-10-01 MED ORDER — NIFEDIPINE ER 60 MG PO TB24: 60.0000 mg | ORAL_TABLET | Freq: Every day | ORAL | 4 refills | Status: AC

## 2017-10-01 NOTE — Discharge Summary (Signed)
Obstetric Discharge Summary Reason for Admission: postpartum preeclampsia Prenatal Procedures: Preeclampsia Intrapartum Procedures: cesarean: low cervical, transverse Postpartum Procedures: magnesium sulfate, procardia and labetalol Complications-Operative and Postpartum: postpartum preeclampsia and refractory hyptertension Hemoglobin  Date Value Ref Range Status  09/28/2017 9.4 (L) 12.0 - 15.0 g/dL Final   HCT  Date Value Ref Range Status  09/28/2017 28.7 (L) 36.0 - 46.0 % Final      Discharge Diagnoses: Preelampsia, post partum  Discharge Information: Date: 10/01/2017 Activity: pelvic rest and add activity slowly Diet: routine and low sodium Medications: Percocet and labetalol, procardia xl Condition: stable Instructions: refer to practice specific booklet Discharge to: home Follow-up Information    [facility], [facility] Follow up in 1 week(s).   Contact information: [address] [place] [address] [phone]           Newborn Data: Live born female  Birth Weight: 7 lb 4.4 oz (3300 g) APGAR: 3, 7  Newborn Delivery   Birth date/time:  09/22/2017 08:35:00 Delivery type:  C-Section, Vacuum Assisted C-section categorization:  Repeat     Home with mother.  Sydney Holloway A 10/01/2017, 8:00 AM

## 2017-10-01 NOTE — Progress Notes (Signed)
Pt discharged with printed instructions per order. Pt verbalized an understanding. Carmelina DaneERRI L Armya Westerhoff, RN

## 2017-10-01 NOTE — Progress Notes (Signed)
  Patient is eating, ambulating, voiding.  Pain control is good.  Vitals:   09/30/17 1536 09/30/17 2113 10/01/17 0008 10/01/17 0516  BP: (!) 156/85 (!) 154/90 (!) 153/89 (!) 162/82  Pulse: 87 90 88 64  Resp: 16 16 18 18   Temp: 98.6 F (37 C) 98.2 F (36.8 C) 98.4 F (36.9 C) 97.8 F (36.6 C)  TempSrc: Oral Oral Oral Oral  SpO2: 99% 99% 99% 100%  Weight:      Height:        lungs:   clear to auscultation cor:    RRR Abdomen:  soft, appropriate tenderness, incisions intact and without erythema or exudate ex:    no cords   Lab Results  Component Value Date   WBC 7.1 09/28/2017   HGB 9.4 (L) 09/28/2017   HCT 28.7 (L) 09/28/2017   MCV 81.5 09/28/2017   PLT 302 09/28/2017      A/P   HD #6, Post operative day 8- postpartum preeclampsia with refractory HTN.  S/p magnesium sulfate for 24 hours.  Now on  Labetalol 600 mg TID and Procardia XL 60  Mg.  BPs now in range safe for d/c.  Expect d/c today.

## 2017-10-08 DIAGNOSIS — I10 Essential (primary) hypertension: Secondary | ICD-10-CM | POA: Diagnosis not present

## 2017-10-22 DIAGNOSIS — Z124 Encounter for screening for malignant neoplasm of cervix: Secondary | ICD-10-CM | POA: Diagnosis not present

## 2017-11-08 DIAGNOSIS — Z3202 Encounter for pregnancy test, result negative: Secondary | ICD-10-CM | POA: Diagnosis not present

## 2017-11-08 DIAGNOSIS — Z30430 Encounter for insertion of intrauterine contraceptive device: Secondary | ICD-10-CM | POA: Diagnosis not present

## 2017-12-27 DIAGNOSIS — Z30431 Encounter for routine checking of intrauterine contraceptive device: Secondary | ICD-10-CM | POA: Diagnosis not present

## 2018-02-04 DIAGNOSIS — Z0000 Encounter for general adult medical examination without abnormal findings: Secondary | ICD-10-CM | POA: Diagnosis not present

## 2018-02-05 DIAGNOSIS — Z0000 Encounter for general adult medical examination without abnormal findings: Secondary | ICD-10-CM | POA: Diagnosis not present

## 2018-02-05 DIAGNOSIS — G43909 Migraine, unspecified, not intractable, without status migrainosus: Secondary | ICD-10-CM | POA: Diagnosis not present

## 2018-02-05 DIAGNOSIS — D649 Anemia, unspecified: Secondary | ICD-10-CM | POA: Diagnosis not present

## 2018-02-05 DIAGNOSIS — I10 Essential (primary) hypertension: Secondary | ICD-10-CM | POA: Diagnosis not present

## 2018-02-19 IMAGING — US US EXTREM LOW VENOUS*L*
1 series · 13 of 24 positions shown · non-contrast
Comparison: None.

CLINICAL DATA: Left leg pain



[Series 1: us extrem low venous*left* · 0.06mm/px · 13 of 35 slices shown]
[im 1/35]
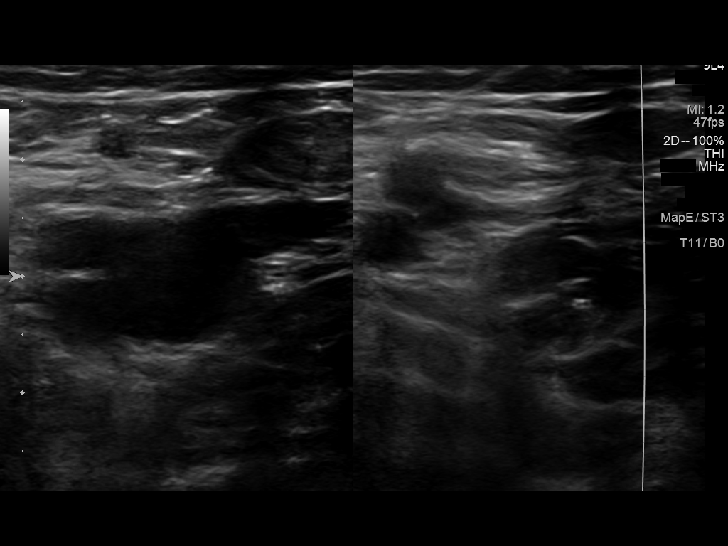
[im 3/35]
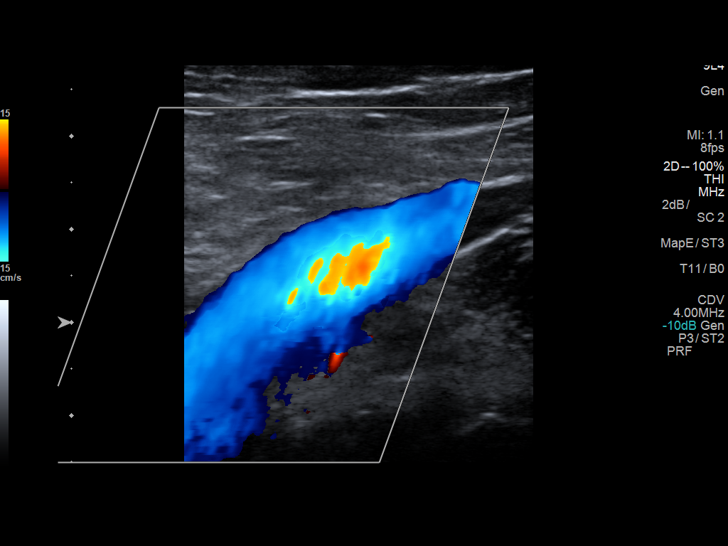
[im 6/35]
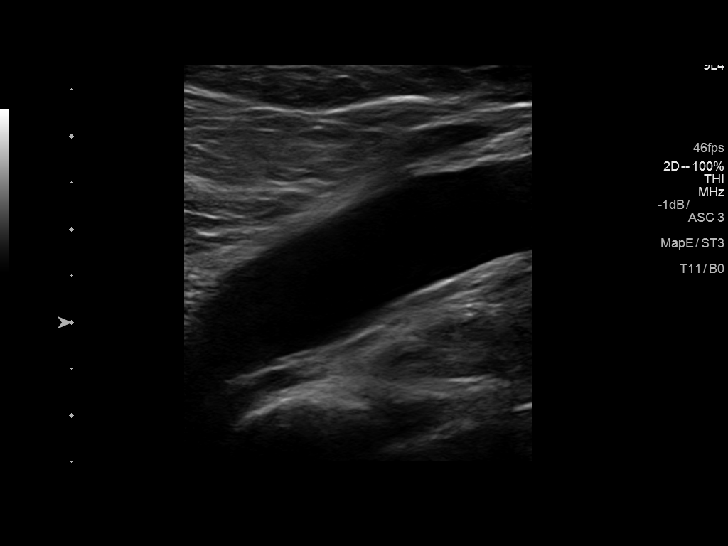
[im 9/35]
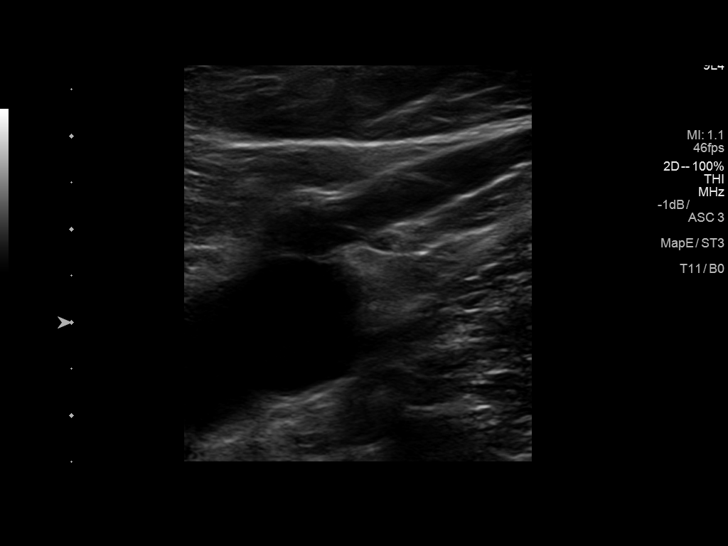
[im 12/35]
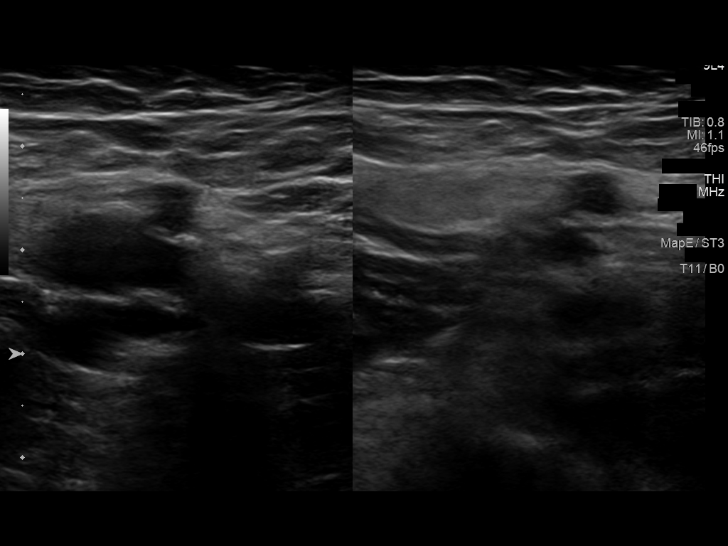
[im 15/35]
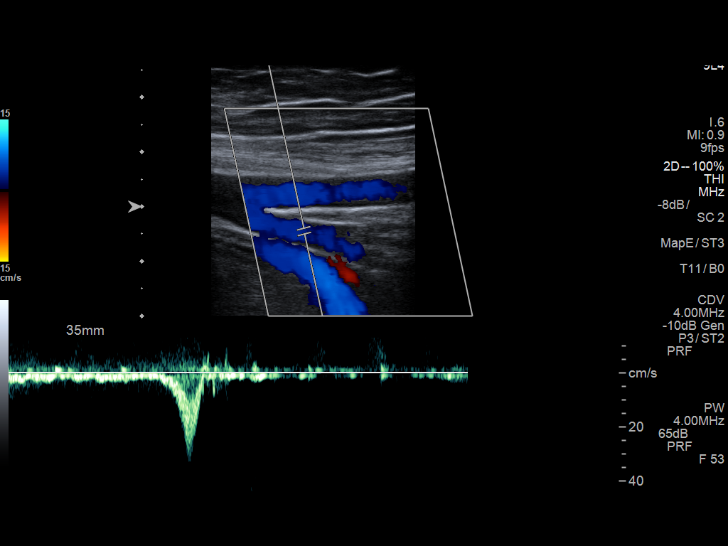
[im 18/35]
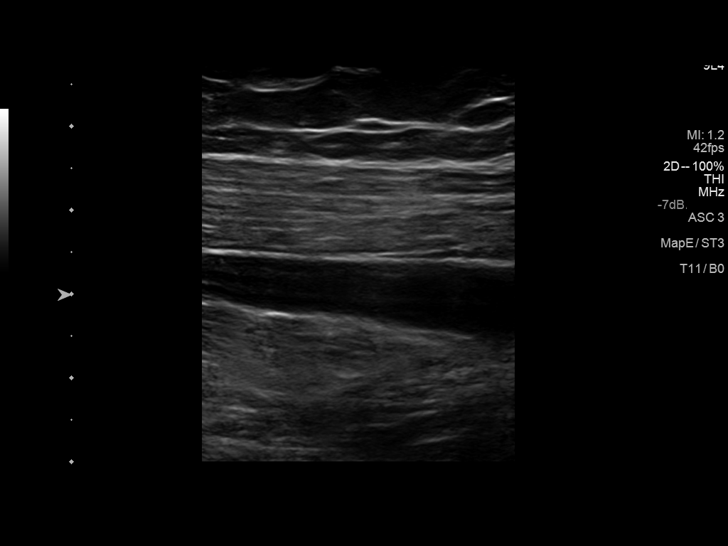
[im 20/35]
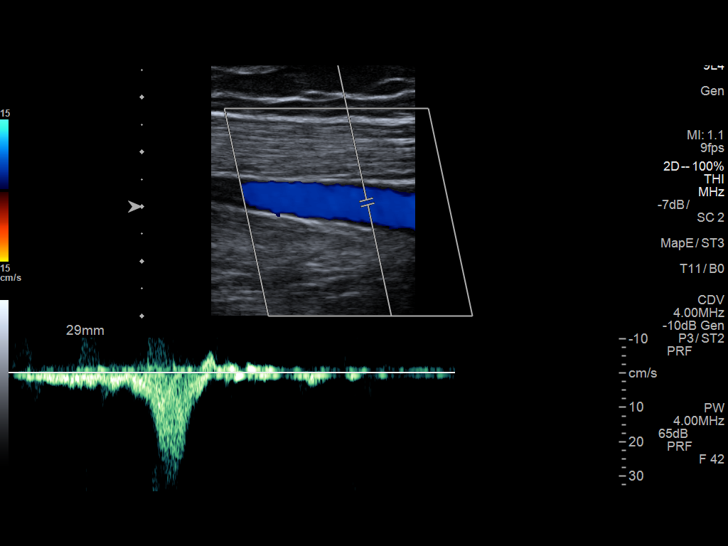
[im 23/35]
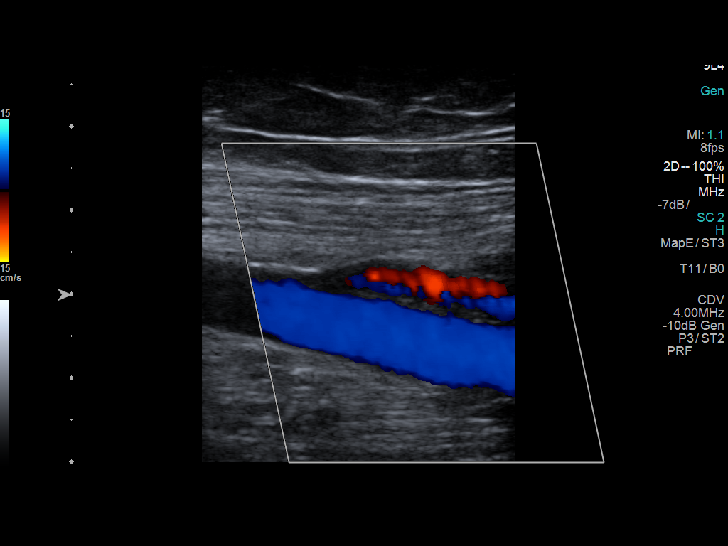
[im 26/35]
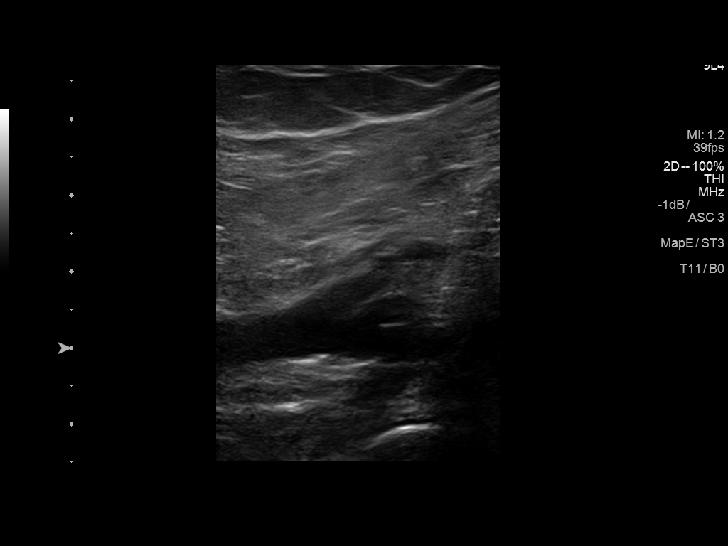
[im 29/35]
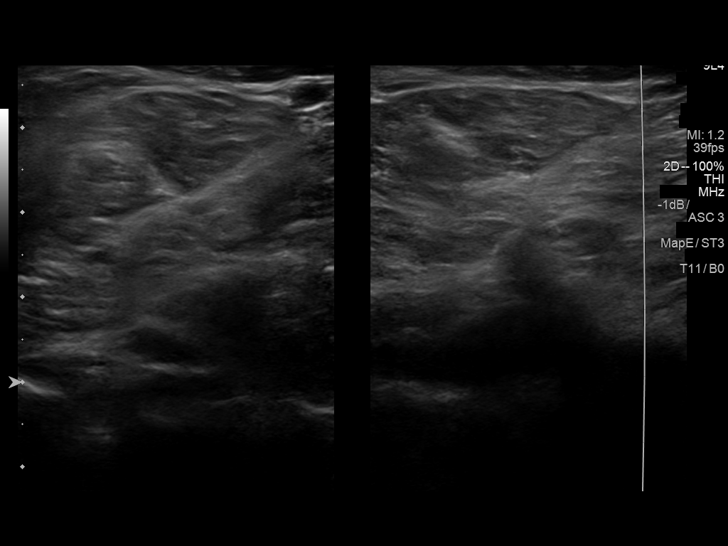
[im 32/35]
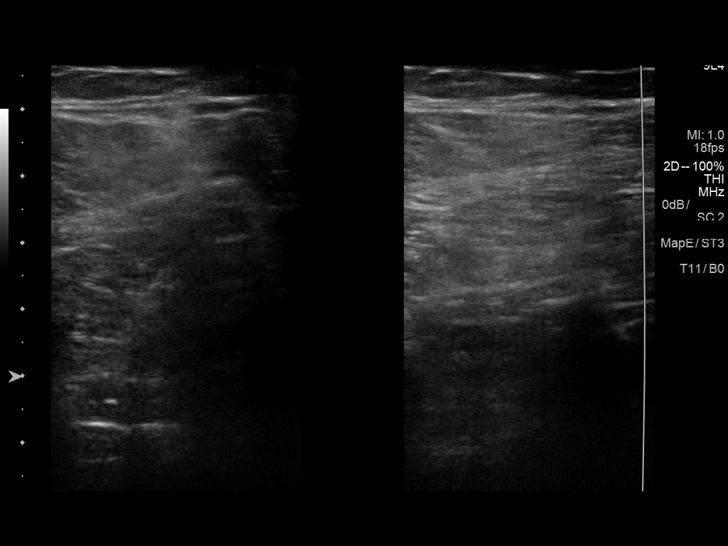
[im 35/35]
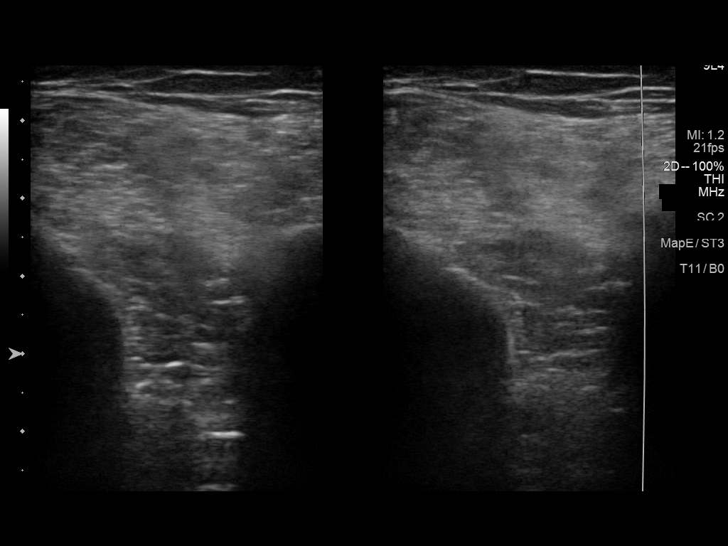

[13 of 24 positions shown; findings below may reference images not displayed]

FINDINGS: Contralateral Common Femoral Vein: Respiratory phasicity is normal
and symmetric with the symptomatic side. No evidence of thrombus.
Normal compressibility.

Common Femoral Vein: No evidence of thrombus. Normal
compressibility, respiratory phasicity and response to augmentation.

Saphenofemoral Junction: No evidence of thrombus. Normal
compressibility and flow on color Doppler imaging.

Profunda Femoral Vein: No evidence of thrombus. Normal
compressibility and flow on color Doppler imaging.

Femoral Vein: No evidence of thrombus. Normal compressibility,
respiratory phasicity and response to augmentation.

Popliteal Vein: No evidence of thrombus. Normal compressibility,
respiratory phasicity and response to augmentation.

Calf Veins: No evidence of thrombus. Normal compressibility and flow
on color Doppler imaging.

Superficial Great Saphenous Vein: No evidence of thrombus. Normal
compressibility and flow on color Doppler imaging.

Venous Reflux:  None.

Other Findings:  None.
IMPRESSION: No evidence of deep venous thrombosis.

## 2018-06-20 DIAGNOSIS — H5213 Myopia, bilateral: Secondary | ICD-10-CM | POA: Diagnosis not present

## 2018-08-12 DIAGNOSIS — J019 Acute sinusitis, unspecified: Secondary | ICD-10-CM | POA: Diagnosis not present

## 2018-12-17 ENCOUNTER — Ambulatory Visit (HOSPITAL_COMMUNITY)
Admission: RE | Admit: 2018-12-17 | Discharge: 2018-12-17 | Disposition: A | Discharge status: Home / Self Care | Payer: Medicaid Other | Source: Ambulatory Visit | Attending: Pulmonary Disease | Admitting: Pulmonary Disease

## 2018-12-17 ENCOUNTER — Other Ambulatory Visit (HOSPITAL_COMMUNITY): Payer: Self-pay | Admitting: Pulmonary Disease

## 2018-12-17 DIAGNOSIS — R52 Pain, unspecified: Secondary | ICD-10-CM | POA: Insufficient documentation

## 2018-12-18 ENCOUNTER — Other Ambulatory Visit (HOSPITAL_COMMUNITY): Payer: Self-pay | Admitting: Pulmonary Disease

## 2018-12-18 DIAGNOSIS — M25511 Pain in right shoulder: Secondary | ICD-10-CM

## 2018-12-26 ENCOUNTER — Ambulatory Visit (HOSPITAL_COMMUNITY)
Admission: RE | Admit: 2018-12-26 | Discharge: 2018-12-26 | Disposition: A | Discharge status: Home / Self Care | Payer: No Typology Code available for payment source | Source: Ambulatory Visit | Attending: Pulmonary Disease | Admitting: Pulmonary Disease

## 2018-12-26 DIAGNOSIS — M25511 Pain in right shoulder: Secondary | ICD-10-CM | POA: Insufficient documentation

## 2019-01-14 ENCOUNTER — Other Ambulatory Visit: Payer: Self-pay

## 2019-01-14 ENCOUNTER — Encounter (HOSPITAL_COMMUNITY): Payer: Self-pay | Admitting: Occupational Therapy

## 2019-01-14 ENCOUNTER — Ambulatory Visit (HOSPITAL_COMMUNITY): Payer: No Typology Code available for payment source | Attending: Orthopedic Surgery | Admitting: Occupational Therapy

## 2019-01-14 DIAGNOSIS — M25511 Pain in right shoulder: Secondary | ICD-10-CM | POA: Diagnosis present

## 2019-01-14 DIAGNOSIS — R29898 Other symptoms and signs involving the musculoskeletal system: Secondary | ICD-10-CM | POA: Diagnosis present

## 2019-01-14 NOTE — Therapy (Signed)
[facility] [address] [place], [place], [address] Phone: [phone]   Fax:  [phone]  Occupational Therapy Evaluation  Patient Details  Name: Sydney Holloway MRN: [id number] Date of Birth: 1976/07/08 Referring Provider (OT): Dr. Teryl Lucy   Encounter Date: 01/14/2019  OT End of Session - 01/14/19 1436    Visit Number  1    Number of Visits  1    Date for OT Re-Evaluation  01/15/19    Authorization Type  [employer]    Authorization Time Period  12 visit limit, 0 used; $30 copay    Authorization - Visit Number  1    Authorization - Number of Visits  12    OT Start Time  1305    OT Stop Time  1338    OT Time Calculation (min)  33 min    Activity Tolerance  Patient tolerated treatment well    Behavior During Therapy  [facility] for tasks assessed/performed       Past Medical History:  Diagnosis Date  . AMA (advanced maternal age) multigravida 35+   . Anemia   . DVT (deep vein thrombosis) in pregnancy 02/28/2017   l leg  . DVT of leg (deep venous thrombosis) (HCC)    right  . Pulmonary embolus (HCC)    bilateral lungs    Past Surgical History:  Procedure Laterality Date  . CESAREAN SECTION    . CESAREAN SECTION N/A 09/22/2017   Procedure: CESAREAN SECTION;  Surgeon: Waynard Reeds, MD;  Location: [facility] BIRTHING SUITES;  Service: Obstetrics;  Laterality: N/A;  . KNEE SURGERY      There were no vitals filed for this visit.  Subjective Assessment - 01/14/19 1340    Subjective   S: He said I have a tear and tendonitis.     Pertinent History  Pt is a 43 y/o female presenting with right shoulder pain and impingement syndrome ongoing for approximately 2 months. Pt reports she has tear in her shoulder but is trying to do conservative mangement and avoid sx. Pt was referred to occupational therapy for evaluation and treatment by Dr. Teryl Lucy.     Patient Stated Goals  To have less pain in my shoulder.     Currently in Pain?  Yes    Pain Score  5     Pain Location  Shoulder    Pain Orientation  Right    Pain Descriptors / Indicators  Sore    Pain Type  Acute pain    Pain Radiating Towards  none    Pain Onset  More than a month ago    Pain Frequency  Intermittent    Aggravating Factors   movement, using at work    Pain Relieving Factors  nothing    Effect of Pain on Daily Activities  min effect on ADLs    Multiple Pain Sites  No        [facility] OT Assessment - 01/14/19 1303      Assessment   Medical Diagnosis  Right shoulder impingement    Referring Provider (OT)  Dr. Teryl Lucy    Onset Date/Surgical Date  11/13/18    Hand Dominance  Right    Next MD Visit  March 2020    Prior Therapy  None      Precautions   Precautions  None      Balance Screen   Has the patient fallen in the past 6 months  No  Has the patient had a decrease in activity level because of a fear of falling?   No    Is the patient reluctant to leave their home because of a fear of falling?   No      Prior Function   Level of Independence  Independent    Vocation  Full time employment    Vocation Requirements  front desk-opening window    Leisure  exercising (1 hour each morning), caring for 44 year old daughter      ADL   ADL comments  Pt is having difficulty with reaching behind her or reaching into back seat of car, daughter laying on her shoulder, lifting weight overhead       Written Expression   Dominant Hand  Right      Cognition   Overall Cognitive Status  Within Functional Limits for tasks assessed      ROM / Strength   AROM / PROM / Strength  AROM;PROM;Strength      Palpation   Palpation comment  moderate fascial restrictions along right upper arm, trapezius, and scapularis regions      AROM   Overall AROM   Within functional limits for tasks performed    Overall AROM Comments  Assessed seated, er/IR adducted    AROM Assessment Site  Shoulder    Right/Left Shoulder  Right    Right Shoulder Flexion  170 Degrees     Right Shoulder ABduction  170 Degrees    Right Shoulder Internal Rotation  90 Degrees    Right Shoulder External Rotation  90 Degrees      PROM   Overall PROM Comments  WNL    PROM Assessment Site  --    Right/Left Shoulder  --      Strength   Overall Strength Comments  Assessed seated, er/IR adducted; Positive Gerber lift-off test-subscapularis strength 3/5    Strength Assessment Site  Shoulder    Right/Left Shoulder  Right    Right Shoulder Flexion  4+/5    Right Shoulder ABduction  4/5    Right Shoulder Internal Rotation  5/5    Right Shoulder External Rotation  5/5                      OT Education - 01/14/19 1340    Education Details  red scapular theraband, red theraband strengthening: protraction, horizontal abduction, abduction; shoulder stretches-flexion, doorway stretch, cross chest stretch    Person(s) Educated  Patient    Methods  Explanation;Demonstration;Handout    Comprehension  Verbalized understanding;Returned demonstration       OT Short Term Goals - 01/14/19 1444      OT SHORT TERM GOAL #1   Title  Pt will be provided with and educated on HEP to improve mobility in right shoulder for ADL and daily functioning.     Time  1    Period  Days    Status  Achieved    Target Date  01/15/19               Plan - 01/14/19 1437    Clinical Impression Statement  A: Pt is a 43 y/o female presenting with right shoulder impingement syndrome. Pt reports pain increasing during the afternoons when working. Also has difficulty reaching in the backseat for her diaper bag. Pt demonstrates ROM WNL and strength is WFL, subscapularis is weakest at 3/5. Pt is agreeable to HEP, provided with shoulder stretches and theraband exercises and demonstrates  good form during completion.     Occupational Profile and client history currently impacting functional performance  Pt is independent and is highly motivated to return to highest level of functioning.      Occupational performance deficits (Please refer to evaluation for details):  ADL's;IADL's;Work;Leisure    Rehab Potential  Good    OT Frequency  One time visit    OT Treatment/Interventions  Patient/family education    Plan  P: Provided HEP, instructed pt to call with questions and if does not improve request new referral from MD for visits.        Patient will benefit from skilled therapeutic intervention in order to improve the following deficits and impairments:  Decreased activity tolerance, Increased fascial restrictions, Pain, Impaired UE functional use, Decreased strength  Visit Diagnosis: Acute pain of right shoulder  Other symptoms and signs involving the musculoskeletal system    Problem List Patient Active Problem List   Diagnosis Date Noted  . Pre-eclampsia in postpartum period 09/26/2017  . Cesarean delivery due to maternal disorder, delivered, curr hospitaliz 09/22/2017  . Medication exposure during first trimester of pregnancy 05/02/2017  . DVT (deep vein thrombosis) in pregnancy 04/05/2017  . Stiffness of joint, not elsewhere classified, lower leg 12/24/2013  . Difficulty in walking(719.7) 12/24/2013  . Decreased strength involving knee joint 12/24/2013  . Pain in joint, lower leg 12/24/2013  . ABDOMINAL BLOATING 12/14/2009  . ABDOMINAL PAIN, GENERALIZED 12/14/2009  . EPIGASTRIC PAIN 12/13/2009    Ezra SitesLeslie Troxler, OTR/L  [phone] 01/14/2019, 2:51 PM  [facility] [address] , [place], [address] Phone: [phone]   Fax:  [phone]  Name: Sydney Holloway MRN: [id number] Date of Birth: 12/29/1975

## 2019-01-14 NOTE — Patient Instructions (Signed)
(  Home) Extension: Isometric / Bilateral Arm Retraction   Facing anchor, hold hands and elbow at shoulder height, with elbow bent.  Pull arms back to squeeze shoulder blades together. Repeat 10-15 times. 1-3 times/day.   (Clinic) Extension / Flexion (Assist)   Face anchor, pull arms back, keeping elbow straight, and squeze shoulder blades together. Repeat 10-15 times. 1-3 times/day.   Copyright  VHI. All rights reserved.   (Home) Retraction: Row - Bilateral (Anchor)   Facing anchor, arms reaching forward, pull hands toward stomach, keeping elbows bent and at your sides and pinching shoulder blades together. Repeat 10-15 times. 1-3 times/day.   Copyright  VHI. All rights reserved.    Theraband strengthening: Complete 10-15X, 1-2X/day  1) Shoulder protraction  Anchor band in doorway, stand with back to door. Push your hand forward as much as you can to bringing your shoulder blades forward on your rib cage.      2) Shoulder horizontal abduction  Standing with a theraband anchored at chest height, begin with arm straight and some tension in the band. Move your arm out to your side (keeping straight the whole time). Bring the affected arm back to midline.      3) Shoulder abduction  While holding an elastic band at your side, draw up your arm to the side keeping your elbow straight.     1) Flexion Wall Stretch    Face wall, place affected handon wall in front of you. Slide hand up the wall  and lean body in towards the wall. Hold for 10 seconds. Repeat 3-5 times. 1-2 times/day.     2) Corner Stretch    Stand at a corner of a wall, place your arms on the walls with elbows bent. Lean into the corner until a stretch is felt along the front of your chest and/or shoulders. Hold for 10 seconds. Repeat 3-5X, 1-2 times/day.    3) Posterior Capsule Stretch    Bring the involved arm across chest. Grasp elbow and pull toward chest until you feel a stretch in the  back of the upper arm and shoulder. Hold 10 seconds. Repeat 3-5X. Complete 1-2 times/day.

## 2019-01-15 ENCOUNTER — Encounter (HOSPITAL_COMMUNITY): Payer: Self-pay

## 2019-01-21 ENCOUNTER — Encounter (HOSPITAL_COMMUNITY): Payer: Self-pay | Admitting: Occupational Therapy

## 2019-01-23 ENCOUNTER — Encounter (HOSPITAL_COMMUNITY): Payer: Self-pay | Admitting: Occupational Therapy

## 2019-02-16 ENCOUNTER — Other Ambulatory Visit: Payer: Self-pay | Admitting: Obstetrics and Gynecology

## 2019-02-16 DIAGNOSIS — R928 Other abnormal and inconclusive findings on diagnostic imaging of breast: Secondary | ICD-10-CM

## 2019-02-19 ENCOUNTER — Other Ambulatory Visit: Payer: Self-pay | Admitting: Obstetrics and Gynecology

## 2019-02-19 ENCOUNTER — Ambulatory Visit
Admission: RE | Admit: 2019-02-19 | Discharge: 2019-02-19 | Disposition: A | Discharge status: Home / Self Care | Payer: No Typology Code available for payment source | Source: Ambulatory Visit | Attending: Obstetrics and Gynecology | Admitting: Obstetrics and Gynecology

## 2019-02-19 ENCOUNTER — Other Ambulatory Visit: Payer: Self-pay

## 2019-02-19 DIAGNOSIS — N63 Unspecified lump in unspecified breast: Secondary | ICD-10-CM

## 2019-02-19 DIAGNOSIS — R928 Other abnormal and inconclusive findings on diagnostic imaging of breast: Secondary | ICD-10-CM

## 2019-02-19 LAB — HM MAMMOGRAPHY

## 2019-07-17 LAB — BASIC METABOLIC PANEL
BUN: 17 (ref 4–21)
CO2: 20 (ref 13–22)
Chloride: 105 (ref 99–108)
Creatinine: 1.1 (ref <=1.1)
Glucose: 98
Potassium: 4.3 (ref 3.4–5.3)
Sodium: 139 (ref 137–147)

## 2019-07-17 LAB — HEMOGLOBIN A1C: Hemoglobin A1C: 5.7

## 2019-07-17 LAB — COMPREHENSIVE METABOLIC PANEL
Albumin: 4.6 (ref 3.5–5.0)
Calcium: 9.6 (ref 8.7–10.7)
GFR calc Af Amer: 69
GFR calc non Af Amer: 60

## 2019-07-17 LAB — CBC AND DIFFERENTIAL
HCT: 38 (ref 36–46)
Hemoglobin: 12.2 (ref 12.0–16.0)
Platelets: 324 (ref 150–399)
WBC: 4.2

## 2019-07-17 LAB — CBC: RBC: 4.71 (ref 3.87–5.11)

## 2019-07-17 LAB — HEPATITIS B SURFACE ANTIGEN: Hepatitis B Surface Ag: NEGATIVE

## 2019-07-17 LAB — HEPATIC FUNCTION PANEL
ALT: 15 (ref 7–35)
AST: 21 (ref 13–35)
Alkaline Phosphatase: 49 (ref 25–125)

## 2019-07-18 LAB — VITAMIN D 25 HYDROXY (VIT D DEFICIENCY, FRACTURES): Vit D, 25-Hydroxy: 12.5

## 2019-07-18 LAB — LIPID PANEL
Cholesterol: 84 (ref 0–200)
HDL: 38 (ref 35–70)
LDL Cholesterol: 132
Triglycerides: 69 (ref 40–160)

## 2019-07-18 LAB — HM HEPATITIS C SCREENING LAB: HM Hepatitis Screen: NEGATIVE

## 2019-08-25 ENCOUNTER — Other Ambulatory Visit: Payer: Self-pay | Admitting: Obstetrics and Gynecology

## 2019-08-25 ENCOUNTER — Ambulatory Visit
Admission: RE | Admit: 2019-08-25 | Discharge: 2019-08-25 | Disposition: A | Discharge status: Home / Self Care | Payer: No Typology Code available for payment source | Source: Ambulatory Visit | Attending: Obstetrics and Gynecology | Admitting: Obstetrics and Gynecology

## 2019-08-25 ENCOUNTER — Other Ambulatory Visit: Payer: Self-pay

## 2019-08-25 DIAGNOSIS — N63 Unspecified lump in unspecified breast: Secondary | ICD-10-CM

## 2019-10-25 DIAGNOSIS — G43909 Migraine, unspecified, not intractable, without status migrainosus: Secondary | ICD-10-CM

## 2019-10-25 DIAGNOSIS — Z8759 Personal history of other complications of pregnancy, childbirth and the puerperium: Secondary | ICD-10-CM | POA: Insufficient documentation

## 2019-10-25 DIAGNOSIS — Z86718 Personal history of other venous thrombosis and embolism: Secondary | ICD-10-CM | POA: Insufficient documentation

## 2019-10-25 DIAGNOSIS — D649 Anemia, unspecified: Secondary | ICD-10-CM | POA: Insufficient documentation

## 2019-10-25 DIAGNOSIS — I10 Essential (primary) hypertension: Secondary | ICD-10-CM

## 2019-10-25 DIAGNOSIS — O24419 Gestational diabetes mellitus in pregnancy, unspecified control: Secondary | ICD-10-CM | POA: Insufficient documentation

## 2020-02-23 ENCOUNTER — Ambulatory Visit
Admission: RE | Admit: 2020-02-23 | Discharge: 2020-02-23 | Disposition: A | Discharge status: Home / Self Care | Payer: No Typology Code available for payment source | Source: Ambulatory Visit | Attending: Obstetrics and Gynecology | Admitting: Obstetrics and Gynecology

## 2020-02-23 ENCOUNTER — Other Ambulatory Visit: Payer: Self-pay

## 2020-02-23 DIAGNOSIS — N63 Unspecified lump in unspecified breast: Secondary | ICD-10-CM

## 2020-03-16 ENCOUNTER — Ambulatory Visit: Payer: No Typology Code available for payment source | Admitting: Family Medicine

## 2020-08-23 ENCOUNTER — Other Ambulatory Visit (HOSPITAL_COMMUNITY): Payer: Self-pay | Admitting: Physician Assistant

## 2021-01-24 ENCOUNTER — Other Ambulatory Visit: Payer: Self-pay | Admitting: Obstetrics and Gynecology

## 2021-01-24 DIAGNOSIS — Z1231 Encounter for screening mammogram for malignant neoplasm of breast: Secondary | ICD-10-CM

## 2021-01-31 ENCOUNTER — Other Ambulatory Visit: Payer: Self-pay | Admitting: Obstetrics and Gynecology

## 2021-01-31 ENCOUNTER — Other Ambulatory Visit (HOSPITAL_COMMUNITY): Payer: Self-pay | Admitting: Family Medicine

## 2021-01-31 DIAGNOSIS — N63 Unspecified lump in unspecified breast: Secondary | ICD-10-CM

## 2021-02-01 ENCOUNTER — Other Ambulatory Visit (HOSPITAL_COMMUNITY): Payer: Self-pay | Admitting: Family Medicine

## 2021-02-14 ENCOUNTER — Other Ambulatory Visit (HOSPITAL_BASED_OUTPATIENT_CLINIC_OR_DEPARTMENT_OTHER): Payer: Self-pay

## 2021-02-17 ENCOUNTER — Other Ambulatory Visit (HOSPITAL_BASED_OUTPATIENT_CLINIC_OR_DEPARTMENT_OTHER): Payer: Self-pay

## 2021-03-03 ENCOUNTER — Other Ambulatory Visit: Payer: Self-pay | Admitting: Obstetrics and Gynecology

## 2021-03-03 DIAGNOSIS — N63 Unspecified lump in unspecified breast: Secondary | ICD-10-CM

## 2021-03-08 ENCOUNTER — Ambulatory Visit
Admission: RE | Admit: 2021-03-08 | Discharge: 2021-03-08 | Disposition: A | Discharge status: Home / Self Care | Payer: No Typology Code available for payment source | Source: Ambulatory Visit | Attending: Obstetrics and Gynecology | Admitting: Obstetrics and Gynecology

## 2021-03-08 ENCOUNTER — Other Ambulatory Visit: Payer: Self-pay

## 2021-03-08 DIAGNOSIS — N63 Unspecified lump in unspecified breast: Secondary | ICD-10-CM

## 2021-05-22 ENCOUNTER — Other Ambulatory Visit (HOSPITAL_COMMUNITY): Payer: Self-pay

## 2021-05-22 MED FILL — Rosuvastatin Calcium Tab 10 MG: ORAL | 90 days supply | Qty: 90 | Fill #0 | Status: AC

## 2021-08-14 ENCOUNTER — Encounter: Payer: Self-pay | Admitting: Internal Medicine

## 2021-09-05 ENCOUNTER — Other Ambulatory Visit: Payer: Self-pay

## 2021-09-05 ENCOUNTER — Other Ambulatory Visit (HOSPITAL_COMMUNITY): Payer: Self-pay

## 2021-09-05 MED ORDER — ROSUVASTATIN CALCIUM 10 MG PO TABS
10.0000 mg | ORAL_TABLET | Freq: Every day | ORAL | 0 refills | Status: DC
  Filled 2021-09-05: qty 90, 90d supply, fill #0

## 2021-10-26 ENCOUNTER — Other Ambulatory Visit: Payer: Self-pay

## 2021-10-26 ENCOUNTER — Other Ambulatory Visit (HOSPITAL_COMMUNITY): Payer: Self-pay | Admitting: Physician Assistant

## 2021-10-26 ENCOUNTER — Ambulatory Visit (HOSPITAL_COMMUNITY)
Admission: RE | Admit: 2021-10-26 | Discharge: 2021-10-26 | Disposition: A | Discharge status: Home / Self Care | Payer: No Typology Code available for payment source | Source: Ambulatory Visit | Attending: Physician Assistant | Admitting: Physician Assistant

## 2021-10-26 ENCOUNTER — Other Ambulatory Visit: Payer: Self-pay | Admitting: Physician Assistant

## 2021-10-26 DIAGNOSIS — R600 Localized edema: Secondary | ICD-10-CM | POA: Diagnosis present

## 2021-10-26 DIAGNOSIS — Z86718 Personal history of other venous thrombosis and embolism: Secondary | ICD-10-CM

## 2021-12-04 ENCOUNTER — Other Ambulatory Visit (HOSPITAL_COMMUNITY): Payer: Self-pay

## 2021-12-04 MED ORDER — TORSEMIDE 20 MG PO TABS
ORAL_TABLET | ORAL | 3 refills | Status: AC
  Filled 2021-12-04: qty 45, 90d supply, fill #0
  Filled 2022-03-23: qty 45, 90d supply, fill #1
  Filled 2022-07-02: qty 45, 90d supply, fill #2

## 2021-12-06 ENCOUNTER — Other Ambulatory Visit (HOSPITAL_COMMUNITY): Payer: Self-pay

## 2022-01-01 ENCOUNTER — Ambulatory Visit: Payer: No Typology Code available for payment source | Admitting: Gastroenterology

## 2022-01-08 ENCOUNTER — Other Ambulatory Visit (HOSPITAL_COMMUNITY): Payer: Self-pay

## 2022-01-08 MED ORDER — ROSUVASTATIN CALCIUM 10 MG PO TABS
10.0000 mg | ORAL_TABLET | Freq: Every day | ORAL | 0 refills | Status: DC
  Filled 2022-01-08: qty 90, 90d supply, fill #0

## 2022-01-12 ENCOUNTER — Other Ambulatory Visit: Payer: Self-pay

## 2022-03-19 IMAGING — US US BREAST*L* LIMITED INC AXILLA
1 series · 10 of 10 positions shown · non-contrast
Comparison: Previous exam(s).

CLINICAL DATA: 45-year-old female presenting for annual bilateral
mammogram and final follow-up of a probably benign left breast mass.

EXAM:
DIGITAL DIAGNOSTIC BILATERAL MAMMOGRAM WITH TOMOSYNTHESIS AND CAD;
ULTRASOUND LEFT BREAST LIMITED
TECHNIQUE: Bilateral digital diagnostic mammography and breast tomosynthesis
was performed. The images were evaluated with computer-aided
detection.; Targeted ultrasound examination of the left breast was
performed

[Series 1: us breast*left* limited inc axilla · 0.06mm/px · 10 of 10 slices shown]
[im 1/10]
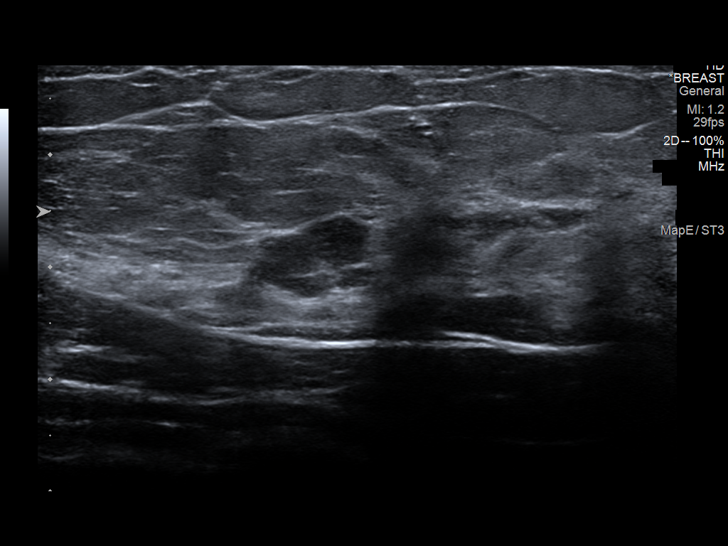
[im 2/10]
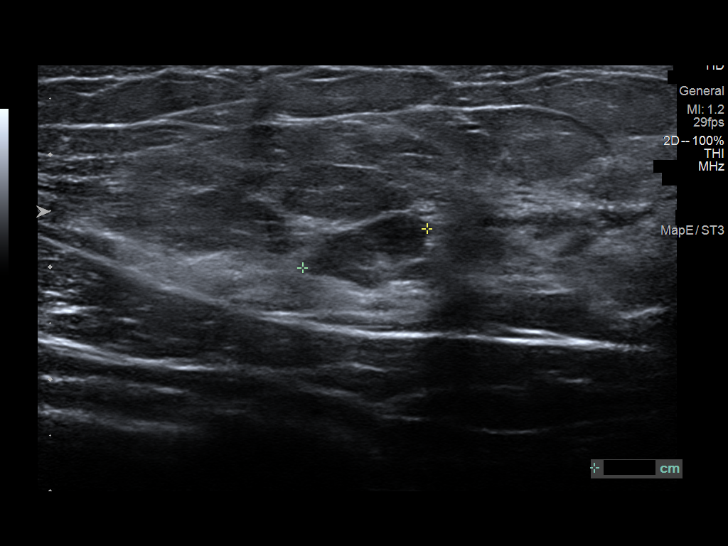
[im 3/10]
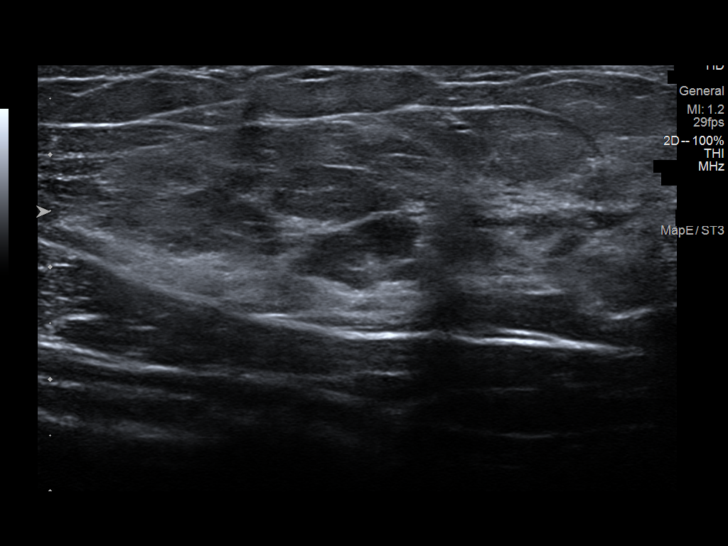
[im 4/10]
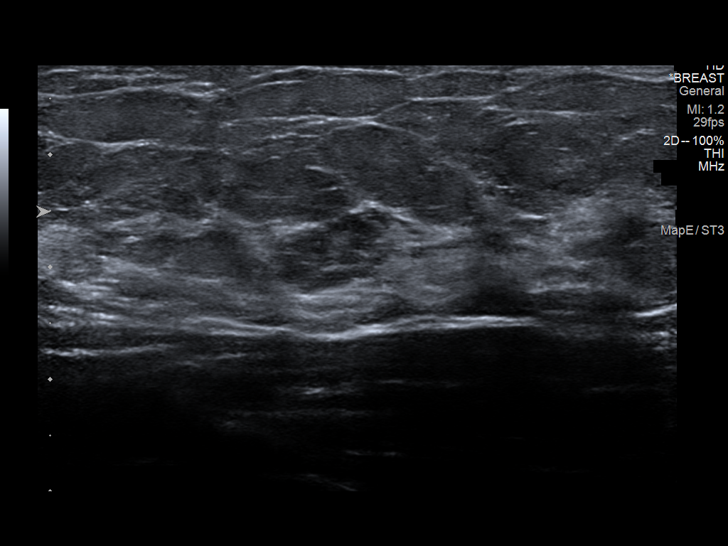
[im 5/10]
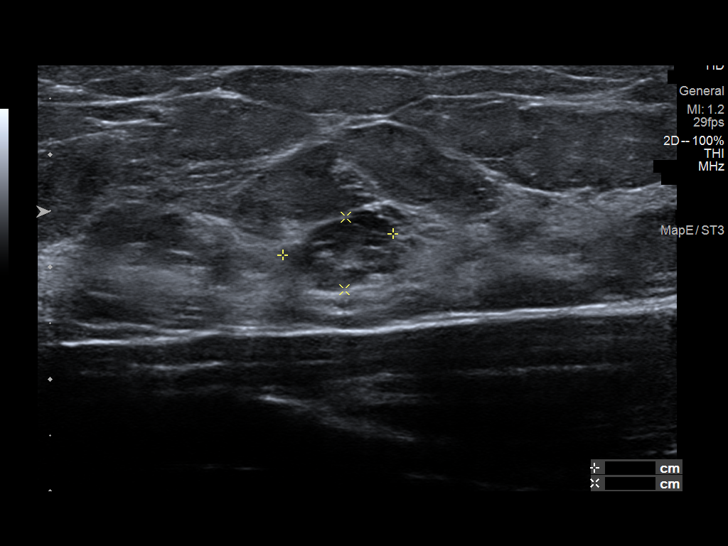
[im 6/10]
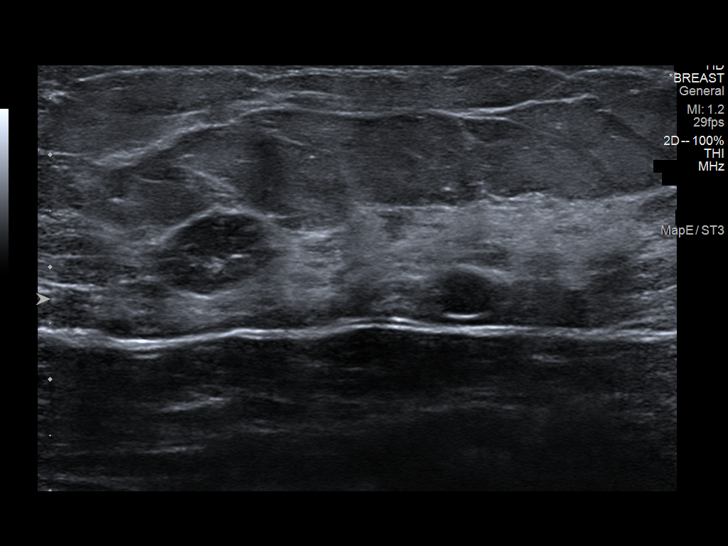
[im 7/10]
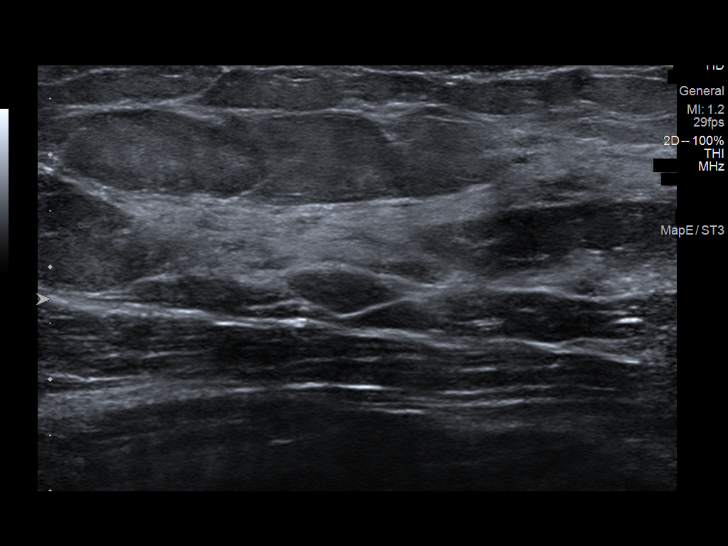
[im 8/10]
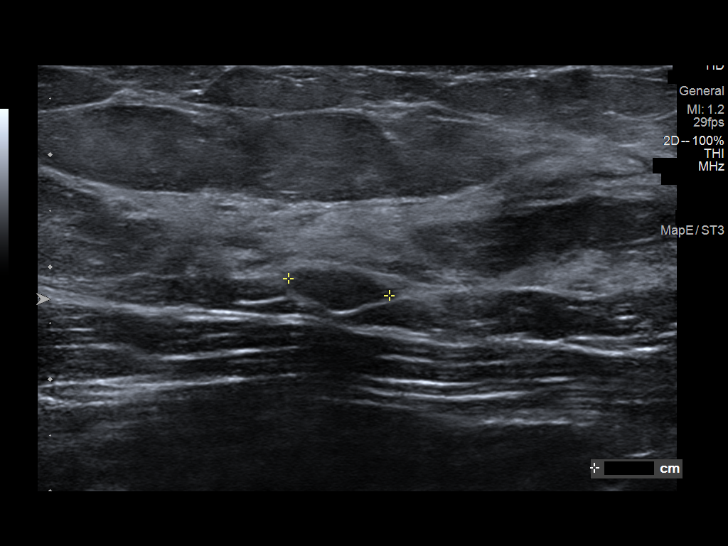
[im 9/10]
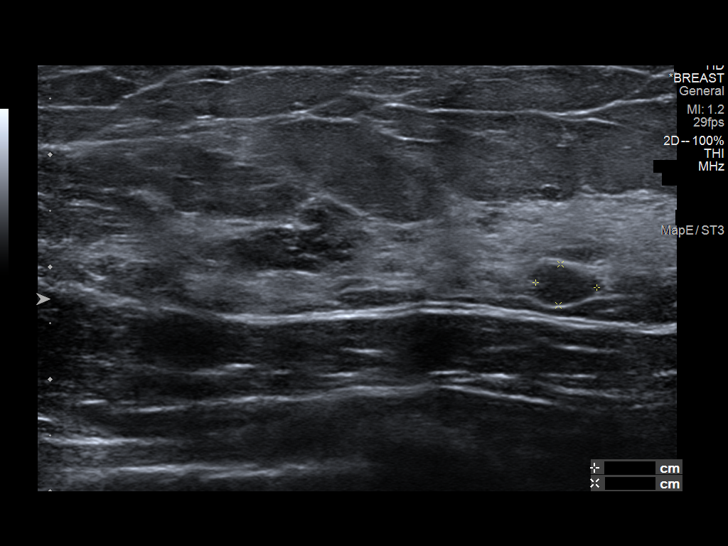
[im 10/10]
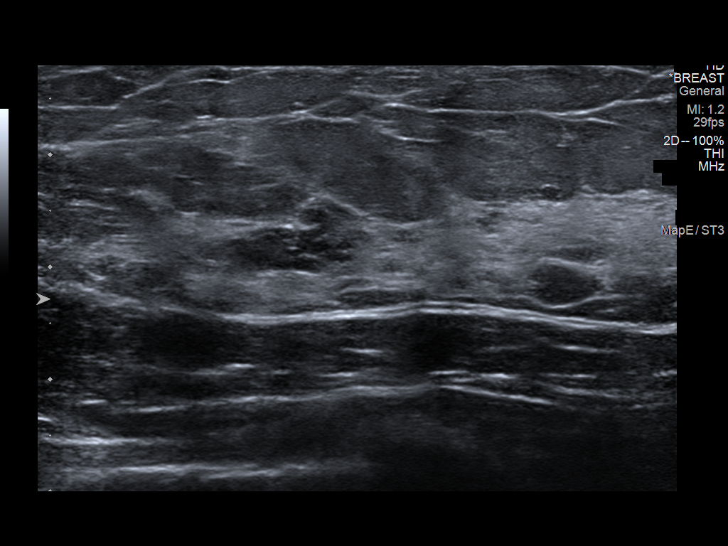

[10 of 10 positions shown; findings below may reference images not displayed]

ACR Breast Density Category c: The breast tissue is heterogeneously
dense, which may obscure small masses.
FINDINGS: No mammographic evidence of malignancy is identified in either
breast. The parenchymal pattern is stable.

Targeted ultrasound is performed, showing stable appearance of an
oval, circumscribed hypoechoic masses at the 12 o'clock position 9
cm from the nipple. They measure 1.2 x 1.0 x 0.7 cm (previously
x 1.1 x 0.6 cm) and 9 x 6 x 4 mm (previously 8 x 6 x 3 mm). Slight
variations are likely due to differences in positioning and
technique.
IMPRESSION: 1. No mammographic evidence of malignancy in either breast.
2. Benign left breast masses demonstrating 2 year stability. No
further imaging follow-up required.

RECOMMENDATION:
Screening mammogram in one year.(Code:[id number])

I have discussed the findings and recommendations with the patient.
If applicable, a reminder letter will be sent to the patient
regarding the next appointment.

BI-RADS CATEGORY  2: Benign.

## 2022-03-23 ENCOUNTER — Other Ambulatory Visit (HOSPITAL_COMMUNITY): Payer: Self-pay

## 2022-04-13 ENCOUNTER — Other Ambulatory Visit (HOSPITAL_COMMUNITY): Payer: Self-pay

## 2022-04-13 MED ORDER — PHENTERMINE HCL 37.5 MG PO TABS
ORAL_TABLET | ORAL | 2 refills | Status: AC
  Filled 2022-04-13: qty 30, 30d supply, fill #0

## 2022-04-13 MED ORDER — TOPIRAMATE 25 MG PO TABS
ORAL_TABLET | ORAL | 4 refills | Status: DC
  Filled 2022-04-13: qty 60, 30d supply, fill #0
  Filled 2022-06-06: qty 60, 30d supply, fill #1
  Filled 2022-07-02: qty 60, 30d supply, fill #2
  Filled 2022-08-10: qty 60, 30d supply, fill #3
  Filled 2022-09-28: qty 60, 30d supply, fill #4

## 2022-05-06 ENCOUNTER — Emergency Department (HOSPITAL_COMMUNITY)
Admission: EM | Admit: 2022-05-06 | Discharge: 2022-05-06 | Disposition: A | Discharge status: Home / Self Care | Payer: No Typology Code available for payment source | Attending: Emergency Medicine | Admitting: Emergency Medicine

## 2022-05-06 ENCOUNTER — Encounter (HOSPITAL_COMMUNITY): Payer: Self-pay | Admitting: *Deleted

## 2022-05-06 ENCOUNTER — Other Ambulatory Visit: Payer: Self-pay

## 2022-05-06 DIAGNOSIS — N939 Abnormal uterine and vaginal bleeding, unspecified: Secondary | ICD-10-CM | POA: Insufficient documentation

## 2022-05-06 DIAGNOSIS — R531 Weakness: Secondary | ICD-10-CM | POA: Insufficient documentation

## 2022-05-06 DIAGNOSIS — R5383 Other fatigue: Secondary | ICD-10-CM | POA: Insufficient documentation

## 2022-05-06 DIAGNOSIS — D72829 Elevated white blood cell count, unspecified: Secondary | ICD-10-CM | POA: Insufficient documentation

## 2022-05-06 DIAGNOSIS — R110 Nausea: Secondary | ICD-10-CM | POA: Diagnosis present

## 2022-05-06 LAB — PREGNANCY, URINE: Preg Test, Ur: NEGATIVE

## 2022-05-06 LAB — URINALYSIS, ROUTINE W REFLEX MICROSCOPIC
Bilirubin Urine: NEGATIVE
Glucose, UA: NEGATIVE mg/dL
Hgb urine dipstick: NEGATIVE
Ketones, ur: NEGATIVE mg/dL
Nitrite: NEGATIVE
Protein, ur: 100 mg/dL — AB
Specific Gravity, Urine: 1.02 (ref 1.005–1.030)
pH: 8 (ref 5.0–8.0)

## 2022-05-06 LAB — TYPE AND SCREEN
ABO/RH(D): AB POS
Antibody Screen: NEGATIVE

## 2022-05-06 LAB — BASIC METABOLIC PANEL
Anion gap: 10 (ref 5–15)
BUN: 11 mg/dL (ref 6–20)
CO2: 23 mmol/L (ref 22–32)
Calcium: 9.5 mg/dL (ref 8.9–10.3)
Chloride: 106 mmol/L (ref 98–111)
Creatinine, Ser: 1.12 mg/dL — ABNORMAL HIGH (ref 0.44–1.00)
GFR, Estimated: >60 mL/min (ref >60)
Glucose, Bld: 111 mg/dL — ABNORMAL HIGH (ref 70–99)
Potassium: 3.9 mmol/L (ref 3.5–5.1)
Sodium: 139 mmol/L (ref 135–145)

## 2022-05-06 LAB — CBC WITH DIFFERENTIAL/PLATELET
Abs Immature Granulocytes: 0.02 10*3/uL (ref 0.00–0.07)
Basophils Absolute: 0 10*3/uL (ref 0.0–0.1)
Basophils Relative: 1 %
Eosinophils Absolute: 0.1 10*3/uL (ref 0.0–0.5)
Eosinophils Relative: 1 %
HCT: 41.3 % (ref 36.0–46.0)
Hemoglobin: 12.8 g/dL (ref 12.0–15.0)
Immature Granulocytes: 0 %
Lymphocytes Relative: 35 %
Lymphs Abs: 2.7 10*3/uL (ref 0.7–4.0)
MCH: 26.3 pg (ref 26.0–34.0)
MCHC: 31 g/dL (ref 30.0–36.0)
MCV: 85 fL (ref 80.0–100.0)
Monocytes Absolute: 0.5 10*3/uL (ref 0.1–1.0)
Monocytes Relative: 7 %
Neutro Abs: 4.2 10*3/uL (ref 1.7–7.7)
Neutrophils Relative %: 56 %
Platelets: 224 10*3/uL (ref 150–400)
RBC: 4.86 MIL/uL (ref 3.87–5.11)
RDW: 13.4 % (ref 11.5–15.5)
WBC: 7.6 10*3/uL (ref 4.0–10.5)
nRBC: 0 % (ref 0.0–0.2)

## 2022-05-06 NOTE — ED Triage Notes (Signed)
C/o nausea x 7-10 days Generalized weakness and fatigue Vaginal bleeding x 20 days, has an appt with GYN on 6/20, getting worse per pt.

## 2022-05-06 NOTE — ED Notes (Signed)
Pt reports that the notifications that she has received on her phone for her HR are 05-05-22 @ 16:33 range 124-129 05-05-2022 @ 15:45 range 120-125 04-09-22 120-122 03-21-22 114-132 Pt reports that she was not doing anything when she received the notifications,

## 2022-05-06 NOTE — ED Provider Notes (Signed)
[facility] EMERGENCY DEPARTMENT Provider Note   CSN: [id number] Arrival date & time: 05/06/22  1607     History  Chief Complaint  Patient presents with   Nausea    Sydney Holloway is a 46 y.o. female.  Presents with complaints of nausea, generalized weakness and fatigue with vaginal bleeding.  Symptoms ongoing for the past 2 to 3 weeks.  Have the vaginal bleeding is stopped a week ago.  She continued to have sensation of weakness and presents to the ER.  Otherwise denies any pain no headache no chest pain no abdominal pain no fever no cough.  Only 1 or 2 episodes of vomiting but persistent nausea has been present.       Home Medications Prior to Admission medications   Medication Sig Start Date End Date Taking? Authorizing Provider  clotrimazole-betamethasone (LOTRISONE) cream Apply 1 application topically 2 (two) times daily.    [provider]  enoxaparin (LOVENOX) 120 MG/0.8ML injection Inject 0.8 mLs (120 mg total) into the skin daily. 08/20/17   Quentin Angst, MD  furosemide (LASIX) 40 MG tablet Take 40 mg by mouth daily.    [provider]  IRON PO Take 1 tablet by mouth daily. otc medication, not sure of dose    [provider]  ketoconazole (NIZORAL) 2 % cream Apply 1 application topically 2 (two) times daily.    [provider]  labetalol (NORMODYNE) 300 MG tablet Take 2 tablets (600 mg total) 3 (three) times daily by mouth. 10/01/17   Carrington Clamp, MD  meclizine (ANTIVERT) 25 MG tablet Take 25 mg by mouth 4 (four) times daily.    [provider]  mometasone (ELOCON) 0.1 % cream Apply 1 application topically 2 (two) times daily.    [provider]  naproxen (NAPROSYN) 500 MG tablet Take 500 mg by mouth 2 (two) times daily with a meal.    [provider]  NIFEdipine (PROCARDIA-XL/ADALAT CC) 60 MG 24 hr tablet Take 1 tablet (60 mg total) daily by mouth. 10/01/17   Carrington Clamp, MD   oxyCODONE-acetaminophen (PERCOCET/ROXICET) 5-325 MG tablet Take 1 tablet by mouth every 4 (four) hours as needed (pain scale 4-7). 09/25/17   Carrington Clamp, MD  phentermine (ADIPEX-P) 37.5 MG tablet Take 1 tablet by mouth daily. 04/13/22     Prenatal Vit-Fe Fumarate-FA (PRENATAL MULTIVITAMIN) TABS tablet Take 1 tablet by mouth daily at 12 noon.    [provider]  rosuvastatin (CRESTOR) 10 MG tablet TAKE 1 TABLET BY MOUTH ONCE A DAY 02/01/21 02/01/22  Terie Purser J, PA-C  rosuvastatin (CRESTOR) 10 MG tablet TAKE 1 TABLET BY MOUTH DAILY 01/31/21 01/31/22  Avis Epley, PA-C  rosuvastatin (CRESTOR) 10 MG tablet Take 1 tablet (10 mg total) by mouth daily. 01/08/22     tobramycin-dexamethasone (TOBRADEX) ophthalmic solution Place 2 drops into both eyes 4 (four) times daily.    [provider]  topiramate (TOPAMAX) 25 MG tablet Take 1 tablet by mouth 2 times a day 04/13/22     Topiramate ER (TROKENDI XR) 100 MG CP24 Take 100 mg by mouth daily.    [provider]  torsemide (DEMADEX) 20 MG tablet Take 20 mg by mouth daily.    [provider]  torsemide (DEMADEX) 20 MG tablet TAKE 1 TABLET BY MOUTH ONCE A DAY 08/23/20 08/23/21  Shawnie Dapper, PA-C  torsemide (DEMADEX) 20 MG tablet Take 1/2 tablet by mouth daily 12/04/21  Allergies    Guaifenesin & derivatives    Review of Systems   Review of Systems  Constitutional:  Negative for fever.  HENT:  Negative for ear pain.   Eyes:  Negative for pain.  Respiratory:  Negative for cough.   Cardiovascular:  Negative for chest pain.  Gastrointestinal:  Negative for abdominal pain.  Genitourinary:  Negative for flank pain.  Musculoskeletal:  Negative for back pain.  Skin:  Negative for rash.  Neurological:  Negative for headaches.    Physical Exam Updated Vital Signs BP (!) 138/92 (BP Location: Left Arm)   Pulse 80   Temp 98 F (36.7 C) (Oral)   Resp 18   Ht 5' (1.524 m)   Wt 71.7 kg   SpO2 100%    BMI 30.86 kg/m  Physical Exam Constitutional:      General: She is not in acute distress.    Appearance: Normal appearance.  HENT:     Head: Normocephalic.     Nose: Nose normal.  Eyes:     Extraocular Movements: Extraocular movements intact.  Cardiovascular:     Rate and Rhythm: Normal rate.  Pulmonary:     Effort: Pulmonary effort is normal.  Abdominal:     Tenderness: There is no abdominal tenderness. There is no guarding or rebound.  Musculoskeletal:        General: Normal range of motion.     Cervical back: Normal range of motion.     Right lower leg: No edema.     Left lower leg: No edema.  Neurological:     General: No focal deficit present.     Mental Status: She is alert. Mental status is at baseline.     ED Results / Procedures / Treatments   Labs (all labs ordered are listed, but only abnormal results are displayed) Labs Reviewed  BASIC METABOLIC PANEL - Abnormal; Notable for the following components:      Result Value   Glucose, Bld 111 (*)    Creatinine, Ser 1.12 (*)    All other components within normal limits  URINALYSIS, ROUTINE W REFLEX MICROSCOPIC - Abnormal; Notable for the following components:   Color, Urine AMBER (*)    APPearance CLOUDY (*)    Protein, ur 100 (*)    Leukocytes,Ua SMALL (*)    Bacteria, UA RARE (*)    All other components within normal limits  PREGNANCY, URINE  CBC WITH DIFFERENTIAL/PLATELET  CBC WITH DIFFERENTIAL/PLATELET  TYPE AND SCREEN    EKG EKG Interpretation  Date/Time:  Sunday May 06 2022 18:21:55 EDT Ventricular Rate:  76 PR Interval:  136 QRS Duration: 83 QT Interval:  373 QTC Calculation: 420 R Axis:   42 Text Interpretation: Sinus rhythm Low voltage, precordial leads Confirmed by Norman Clay ([id number]) on 05/06/2022 8:03:54 PM  Radiology No results found.  Procedures Procedures    Medications Ordered in ED Medications - No data to display  ED Course/ Medical Decision Making/ A&P                            Medical Decision Making Amount and/or Complexity of Data Reviewed Labs: ordered.   Review of external records shows therapy visit January 14, 2019 with occupational therapist.  History obtained from family at bedside.  Cardiac monitoring showing sinus rhythm.  Patient states that she has an Apple Watch that alerted her that her heart rate was 110 to 130 bpm at times this  past week.  Diagnostic studies were sent white count normal 7 hemoglobin stable at 12.8.  Chemistry otherwise unremarkable.  Urinalysis shows small leukocytes with rare bacteria but squamous cells mildly elevated at 6-10.  Patient otherwise denies any dysuria or urinary tract infection symptoms no flank pain no fevers.  Etiology of her fatigue and weakness unclear.  No focal neurodeficit noted on exam.  We will recommend outpatient follow-up with her primary care doctor for work-up for her generalized fatigue.  She has an appointment pending with OB/GYN coming up within the next 2 weeks.  She has no vaginal bleeding at present last bleeding episode was a week ago.  Advising immediate return if she has chest pain fevers or worsening symptoms otherwise recommending outpatient follow-up with her primary care doctor this week.        Final Clinical Impression(s) / ED Diagnoses Final diagnoses:  Nausea  Weakness    Rx / DC Orders ED Discharge Orders     None         Cheryll CockayneHong, Lucille Crichlow S, MD 05/06/22 2007

## 2022-05-06 NOTE — Discharge Instructions (Addendum)
Call your primary care doctor or specialist as discussed in the next 2-3 days.   Return immediately back to the ER if:  Your symptoms worsen within the next 12-24 hours. You develop new symptoms such as new fevers, persistent vomiting, new pain, shortness of breath, or new weakness or numbness, or if you have any other concerns.  

## 2022-05-07 ENCOUNTER — Other Ambulatory Visit (HOSPITAL_COMMUNITY): Payer: Self-pay

## 2022-05-07 MED ORDER — CIPROFLOXACIN HCL 500 MG PO TABS
ORAL_TABLET | ORAL | 0 refills | Status: DC
  Filled 2022-05-07: qty 14, 7d supply, fill #0

## 2022-05-19 ENCOUNTER — Other Ambulatory Visit (HOSPITAL_COMMUNITY): Payer: Self-pay

## 2022-05-21 ENCOUNTER — Other Ambulatory Visit (HOSPITAL_COMMUNITY): Payer: Self-pay

## 2022-05-21 MED ORDER — ROSUVASTATIN CALCIUM 10 MG PO TABS
10.0000 mg | ORAL_TABLET | Freq: Every day | ORAL | 1 refills | Status: DC
  Filled 2022-05-21: qty 90, 90d supply, fill #0
  Filled 2022-08-10: qty 90, 90d supply, fill #1

## 2022-06-01 ENCOUNTER — Other Ambulatory Visit (HOSPITAL_COMMUNITY): Payer: Self-pay

## 2022-06-01 MED ORDER — OMEPRAZOLE 20 MG PO CPDR
DELAYED_RELEASE_CAPSULE | ORAL | 3 refills | Status: DC
  Filled 2022-06-01: qty 90, 90d supply, fill #0
  Filled 2022-08-30: qty 90, 90d supply, fill #1
  Filled 2022-11-27: qty 90, 90d supply, fill #2
  Filled 2023-02-26: qty 90, 90d supply, fill #3

## 2022-06-01 MED ORDER — SAXENDA 18 MG/3ML ~~LOC~~ SOPN
PEN_INJECTOR | SUBCUTANEOUS | 5 refills | Status: DC
  Filled 2022-06-01: qty 15, 30d supply, fill #0
  Filled 2022-08-10: qty 15, 30d supply, fill #1

## 2022-06-01 MED ORDER — SUMATRIPTAN SUCCINATE 100 MG PO TABS
ORAL_TABLET | ORAL | 5 refills | Status: AC
  Filled 2022-06-01: qty 9, 30d supply, fill #0
  Filled 2022-07-02: qty 9, 30d supply, fill #1
  Filled 2022-11-27: qty 9, 30d supply, fill #2

## 2022-06-01 MED ORDER — ONDANSETRON HCL 4 MG PO TABS
ORAL_TABLET | ORAL | 1 refills | Status: DC
  Filled 2022-06-01: qty 20, 5d supply, fill #0
  Filled 2022-07-02: qty 20, 5d supply, fill #1

## 2022-06-04 ENCOUNTER — Other Ambulatory Visit (HOSPITAL_COMMUNITY): Payer: Self-pay

## 2022-06-04 ENCOUNTER — Encounter (HOSPITAL_COMMUNITY): Payer: Self-pay | Admitting: Pharmacist

## 2022-06-06 ENCOUNTER — Other Ambulatory Visit (HOSPITAL_COMMUNITY): Payer: Self-pay

## 2022-06-15 ENCOUNTER — Other Ambulatory Visit (HOSPITAL_COMMUNITY): Payer: Self-pay

## 2022-07-02 ENCOUNTER — Other Ambulatory Visit (HOSPITAL_COMMUNITY): Payer: Self-pay

## 2022-07-02 ENCOUNTER — Other Ambulatory Visit: Payer: Self-pay

## 2022-07-02 MED ORDER — ROSUVASTATIN CALCIUM 10 MG PO TABS
10.0000 mg | ORAL_TABLET | Freq: Every day | ORAL | 3 refills | Status: AC
  Filled 2022-07-02 – 2022-11-27 (×3): qty 90, 90d supply, fill #0
  Filled 2023-02-26: qty 90, 90d supply, fill #1

## 2022-07-05 ENCOUNTER — Other Ambulatory Visit (HOSPITAL_COMMUNITY): Payer: Self-pay

## 2022-07-19 ENCOUNTER — Other Ambulatory Visit (HOSPITAL_COMMUNITY): Payer: Self-pay

## 2022-07-19 MED ORDER — NORETHINDRONE 0.35 MG PO TABS
ORAL_TABLET | ORAL | 3 refills | Status: AC
  Filled 2022-07-19: qty 84, 84d supply, fill #0
  Filled 2022-10-06 – 2022-10-19 (×2): qty 84, 84d supply, fill #1
  Filled 2023-01-14: qty 84, 84d supply, fill #2

## 2022-08-10 ENCOUNTER — Other Ambulatory Visit (HOSPITAL_COMMUNITY): Payer: Self-pay

## 2022-08-10 MED ORDER — LIRAGLUTIDE -WEIGHT MANAGEMENT 18 MG/3ML ~~LOC~~ SOPN
PEN_INJECTOR | SUBCUTANEOUS | 1 refills | Status: DC
Start: 2022-08-10 — End: 2022-08-15
  Filled 2022-08-10: qty 6, 12d supply, fill #0

## 2022-08-14 ENCOUNTER — Other Ambulatory Visit (HOSPITAL_COMMUNITY): Payer: Self-pay

## 2022-08-15 ENCOUNTER — Other Ambulatory Visit (HOSPITAL_COMMUNITY): Payer: Self-pay

## 2022-08-15 MED ORDER — SAXENDA 18 MG/3ML ~~LOC~~ SOPN
PEN_INJECTOR | SUBCUTANEOUS | 1 refills | Status: DC
  Filled 2022-08-15: qty 15, 30d supply, fill #0
  Filled 2022-09-07: qty 15, 30d supply, fill #1

## 2022-08-15 MED ORDER — INSULIN PEN NEEDLE 31G X 6 MM MISC
0 refills | Status: AC
  Filled 2022-08-15: qty 100, 90d supply, fill #0

## 2022-08-30 ENCOUNTER — Other Ambulatory Visit (HOSPITAL_COMMUNITY): Payer: Self-pay

## 2022-08-31 ENCOUNTER — Other Ambulatory Visit (HOSPITAL_COMMUNITY): Payer: Self-pay

## 2022-08-31 MED ORDER — TORSEMIDE 20 MG PO TABS
20.0000 mg | ORAL_TABLET | Freq: Every day | ORAL | 0 refills | Status: AC
Start: 2022-08-31 — End: ?
  Filled 2022-08-31 (×2): qty 90, 90d supply, fill #0

## 2022-09-07 ENCOUNTER — Other Ambulatory Visit (HOSPITAL_COMMUNITY): Payer: Self-pay

## 2022-09-08 ENCOUNTER — Other Ambulatory Visit (HOSPITAL_COMMUNITY): Payer: Self-pay

## 2022-09-10 ENCOUNTER — Other Ambulatory Visit (HOSPITAL_COMMUNITY): Payer: Self-pay

## 2022-09-10 MED ORDER — ONDANSETRON HCL 4 MG PO TABS
ORAL_TABLET | ORAL | 1 refills | Status: AC
  Filled 2022-09-10: qty 20, 3d supply, fill #0

## 2022-09-28 ENCOUNTER — Other Ambulatory Visit (HOSPITAL_COMMUNITY): Payer: Self-pay

## 2022-10-06 ENCOUNTER — Other Ambulatory Visit (HOSPITAL_COMMUNITY): Payer: Self-pay

## 2022-10-09 ENCOUNTER — Other Ambulatory Visit (HOSPITAL_COMMUNITY): Payer: Self-pay

## 2022-10-11 ENCOUNTER — Other Ambulatory Visit (HOSPITAL_COMMUNITY): Payer: Self-pay

## 2022-10-11 MED ORDER — SAXENDA 18 MG/3ML ~~LOC~~ SOPN
3.0000 mg | PEN_INJECTOR | Freq: Every day | SUBCUTANEOUS | 5 refills | Status: AC
Start: 2022-10-11 — End: ?
  Filled 2022-10-11 – 2022-10-30 (×3): qty 15, 30d supply, fill #0
  Filled 2022-11-27 – 2022-12-04 (×2): qty 15, 30d supply, fill #1

## 2022-10-17 ENCOUNTER — Other Ambulatory Visit (HOSPITAL_COMMUNITY): Payer: Self-pay

## 2022-10-19 ENCOUNTER — Other Ambulatory Visit (HOSPITAL_COMMUNITY): Payer: Self-pay

## 2022-10-30 ENCOUNTER — Other Ambulatory Visit (HOSPITAL_COMMUNITY): Payer: Self-pay

## 2022-11-27 ENCOUNTER — Other Ambulatory Visit: Payer: Self-pay

## 2022-11-27 ENCOUNTER — Other Ambulatory Visit (HOSPITAL_COMMUNITY): Payer: Self-pay

## 2022-11-28 ENCOUNTER — Other Ambulatory Visit (HOSPITAL_COMMUNITY): Payer: Self-pay

## 2022-11-28 ENCOUNTER — Other Ambulatory Visit: Payer: Self-pay

## 2022-11-28 MED ORDER — ROSUVASTATIN CALCIUM 10 MG PO TABS
10.0000 mg | ORAL_TABLET | Freq: Every day | ORAL | 1 refills | Status: DC
  Filled 2022-11-28 – 2023-02-21 (×3): qty 90, 90d supply, fill #0
  Filled 2023-06-26: qty 90, 90d supply, fill #1

## 2022-11-28 MED ORDER — TOPIRAMATE 25 MG PO TABS
25.0000 mg | ORAL_TABLET | Freq: Two times a day (BID) | ORAL | 4 refills | Status: DC
  Filled 2022-11-28 (×2): qty 60, 30d supply, fill #0
  Filled 2023-02-04: qty 60, 30d supply, fill #1
  Filled 2023-04-18: qty 60, 30d supply, fill #2
  Filled 2023-06-26: qty 60, 30d supply, fill #3
  Filled 2023-10-02: qty 60, 30d supply, fill #4

## 2022-11-29 ENCOUNTER — Other Ambulatory Visit (HOSPITAL_COMMUNITY): Payer: Self-pay

## 2022-12-04 ENCOUNTER — Other Ambulatory Visit (HOSPITAL_COMMUNITY): Payer: Self-pay

## 2022-12-05 ENCOUNTER — Other Ambulatory Visit (HOSPITAL_COMMUNITY): Payer: Self-pay

## 2022-12-05 ENCOUNTER — Other Ambulatory Visit: Payer: Self-pay

## 2022-12-05 ENCOUNTER — Encounter: Payer: Self-pay | Admitting: Pharmacist

## 2022-12-07 ENCOUNTER — Other Ambulatory Visit (HOSPITAL_COMMUNITY): Payer: Self-pay

## 2022-12-07 MED ORDER — ZEPBOUND 7.5 MG/0.5ML ~~LOC~~ SOAJ
7.5000 mg | SUBCUTANEOUS | 5 refills | Status: AC
Start: 2022-12-06 — End: ?
  Filled 2022-12-07: qty 2, 28d supply, fill #0
  Filled 2023-01-24 – 2023-02-04 (×3): qty 2, 28d supply, fill #1
  Filled 2023-02-26: qty 2, 28d supply, fill #2
  Filled 2023-03-22: qty 2, 28d supply, fill #3

## 2022-12-13 ENCOUNTER — Other Ambulatory Visit (HOSPITAL_COMMUNITY): Payer: Self-pay

## 2022-12-13 DIAGNOSIS — J029 Acute pharyngitis, unspecified: Secondary | ICD-10-CM | POA: Diagnosis not present

## 2022-12-13 DIAGNOSIS — D509 Iron deficiency anemia, unspecified: Secondary | ICD-10-CM | POA: Diagnosis not present

## 2022-12-19 ENCOUNTER — Other Ambulatory Visit: Payer: Self-pay

## 2022-12-25 ENCOUNTER — Other Ambulatory Visit (HOSPITAL_COMMUNITY): Payer: Self-pay

## 2022-12-25 ENCOUNTER — Other Ambulatory Visit: Payer: Self-pay

## 2022-12-25 DIAGNOSIS — N920 Excessive and frequent menstruation with regular cycle: Secondary | ICD-10-CM | POA: Diagnosis not present

## 2022-12-25 MED ORDER — MISOPROSTOL 200 MCG PO TABS
200.0000 ug | ORAL_TABLET | Freq: Once | ORAL | 0 refills | Status: DC
  Filled 2022-12-25: qty 1, 1d supply, fill #0

## 2022-12-25 MED ORDER — ESTRADIOL 10 MCG VA TABS
10.0000 ug | ORAL_TABLET | Freq: Every day | VAGINAL | 0 refills | Status: DC
  Filled 2022-12-25: qty 8, 8d supply, fill #0

## 2022-12-26 ENCOUNTER — Other Ambulatory Visit (HOSPITAL_COMMUNITY): Payer: Self-pay

## 2022-12-31 ENCOUNTER — Other Ambulatory Visit: Payer: Self-pay

## 2023-01-01 ENCOUNTER — Other Ambulatory Visit: Payer: Self-pay

## 2023-01-11 ENCOUNTER — Other Ambulatory Visit (HOSPITAL_COMMUNITY): Payer: Self-pay

## 2023-01-11 ENCOUNTER — Other Ambulatory Visit: Payer: Self-pay

## 2023-01-14 ENCOUNTER — Other Ambulatory Visit: Payer: Self-pay

## 2023-01-14 ENCOUNTER — Other Ambulatory Visit (HOSPITAL_COMMUNITY): Payer: Self-pay

## 2023-01-15 ENCOUNTER — Other Ambulatory Visit (HOSPITAL_COMMUNITY): Payer: Self-pay

## 2023-01-19 ENCOUNTER — Other Ambulatory Visit (HOSPITAL_COMMUNITY): Payer: Self-pay

## 2023-01-24 ENCOUNTER — Other Ambulatory Visit (HOSPITAL_COMMUNITY): Payer: Self-pay

## 2023-02-02 ENCOUNTER — Other Ambulatory Visit (HOSPITAL_COMMUNITY): Payer: Self-pay

## 2023-02-04 ENCOUNTER — Other Ambulatory Visit: Payer: Self-pay

## 2023-02-11 ENCOUNTER — Ambulatory Visit (HOSPITAL_COMMUNITY)
Admission: RE | Admit: 2023-02-11 | Discharge: 2023-02-11 | Disposition: A | Discharge status: Home / Self Care | Payer: Commercial Managed Care - PPO | Source: Ambulatory Visit | Attending: Internal Medicine | Admitting: Internal Medicine

## 2023-02-11 ENCOUNTER — Other Ambulatory Visit (HOSPITAL_COMMUNITY): Payer: Self-pay | Admitting: Internal Medicine

## 2023-02-11 DIAGNOSIS — M7989 Other specified soft tissue disorders: Secondary | ICD-10-CM | POA: Diagnosis not present

## 2023-02-11 DIAGNOSIS — R200 Anesthesia of skin: Secondary | ICD-10-CM | POA: Diagnosis not present

## 2023-02-11 DIAGNOSIS — M79671 Pain in right foot: Secondary | ICD-10-CM | POA: Insufficient documentation

## 2023-02-21 ENCOUNTER — Other Ambulatory Visit: Payer: Self-pay

## 2023-02-26 ENCOUNTER — Other Ambulatory Visit: Payer: Self-pay

## 2023-02-28 ENCOUNTER — Other Ambulatory Visit: Payer: Self-pay

## 2023-02-28 ENCOUNTER — Other Ambulatory Visit (HOSPITAL_COMMUNITY): Payer: Self-pay

## 2023-03-04 DIAGNOSIS — N939 Abnormal uterine and vaginal bleeding, unspecified: Secondary | ICD-10-CM | POA: Diagnosis not present

## 2023-03-08 DIAGNOSIS — Z30430 Encounter for insertion of intrauterine contraceptive device: Secondary | ICD-10-CM | POA: Diagnosis not present

## 2023-03-08 DIAGNOSIS — N920 Excessive and frequent menstruation with regular cycle: Secondary | ICD-10-CM | POA: Diagnosis not present

## 2023-03-22 ENCOUNTER — Other Ambulatory Visit (HOSPITAL_COMMUNITY): Payer: Self-pay

## 2023-04-04 ENCOUNTER — Other Ambulatory Visit (HOSPITAL_COMMUNITY): Payer: Self-pay

## 2023-04-04 DIAGNOSIS — Z30431 Encounter for routine checking of intrauterine contraceptive device: Secondary | ICD-10-CM | POA: Diagnosis not present

## 2023-04-18 ENCOUNTER — Other Ambulatory Visit: Payer: Self-pay

## 2023-04-18 ENCOUNTER — Other Ambulatory Visit (HOSPITAL_COMMUNITY): Payer: Self-pay

## 2023-06-12 DIAGNOSIS — N289 Disorder of kidney and ureter, unspecified: Secondary | ICD-10-CM | POA: Diagnosis not present

## 2023-06-26 ENCOUNTER — Other Ambulatory Visit (HOSPITAL_COMMUNITY): Payer: Self-pay

## 2023-06-27 ENCOUNTER — Other Ambulatory Visit: Payer: Self-pay

## 2023-06-27 ENCOUNTER — Other Ambulatory Visit (HOSPITAL_COMMUNITY): Payer: Self-pay

## 2023-06-27 MED ORDER — OMEPRAZOLE 20 MG PO CPDR
20.0000 mg | DELAYED_RELEASE_CAPSULE | Freq: Every day | ORAL | 3 refills | Status: DC
  Filled 2023-06-27: qty 90, 90d supply, fill #0
  Filled 2023-10-02: qty 90, 90d supply, fill #1
  Filled 2024-02-12: qty 90, 90d supply, fill #2
  Filled 2024-06-22: qty 90, 90d supply, fill #3

## 2023-07-27 DIAGNOSIS — H524 Presbyopia: Secondary | ICD-10-CM | POA: Diagnosis not present

## 2023-08-30 ENCOUNTER — Other Ambulatory Visit (HOSPITAL_COMMUNITY): Payer: Self-pay

## 2023-09-03 ENCOUNTER — Other Ambulatory Visit: Payer: Self-pay

## 2023-09-03 ENCOUNTER — Other Ambulatory Visit (HOSPITAL_COMMUNITY): Payer: Self-pay

## 2023-09-03 ENCOUNTER — Encounter: Payer: Self-pay | Admitting: Pharmacist

## 2023-09-03 MED ORDER — PHENTERMINE HCL 37.5 MG PO TABS
18.7500 mg | ORAL_TABLET | Freq: Every morning | ORAL | 2 refills | Status: AC
  Filled 2023-09-03: qty 30, 30d supply, fill #0
  Filled 2023-12-06: qty 30, 30d supply, fill #1
  Filled 2024-02-12: qty 30, 30d supply, fill #2

## 2023-10-02 ENCOUNTER — Other Ambulatory Visit (HOSPITAL_COMMUNITY): Payer: Self-pay

## 2023-10-03 ENCOUNTER — Other Ambulatory Visit (HOSPITAL_COMMUNITY): Payer: Self-pay

## 2023-10-03 ENCOUNTER — Other Ambulatory Visit: Payer: Self-pay

## 2023-10-03 MED ORDER — ROSUVASTATIN CALCIUM 10 MG PO TABS
10.0000 mg | ORAL_TABLET | Freq: Every day | ORAL | 0 refills | Status: DC
  Filled 2023-10-03 (×2): qty 90, 90d supply, fill #0

## 2023-10-12 ENCOUNTER — Emergency Department (HOSPITAL_COMMUNITY): Payer: Commercial Managed Care - PPO

## 2023-10-12 ENCOUNTER — Other Ambulatory Visit: Payer: Self-pay

## 2023-10-12 ENCOUNTER — Emergency Department (HOSPITAL_COMMUNITY)
Admission: EM | Admit: 2023-10-12 | Discharge: 2023-10-12 | Disposition: A | Discharge status: Home / Self Care | Payer: Commercial Managed Care - PPO | Attending: Emergency Medicine | Admitting: Emergency Medicine

## 2023-10-12 ENCOUNTER — Encounter (HOSPITAL_COMMUNITY): Payer: Self-pay

## 2023-10-12 DIAGNOSIS — Z794 Long term (current) use of insulin: Secondary | ICD-10-CM | POA: Diagnosis not present

## 2023-10-12 DIAGNOSIS — R000 Tachycardia, unspecified: Secondary | ICD-10-CM | POA: Insufficient documentation

## 2023-10-12 DIAGNOSIS — R109 Unspecified abdominal pain: Secondary | ICD-10-CM | POA: Diagnosis not present

## 2023-10-12 DIAGNOSIS — N12 Tubulo-interstitial nephritis, not specified as acute or chronic: Secondary | ICD-10-CM | POA: Insufficient documentation

## 2023-10-12 DIAGNOSIS — R079 Chest pain, unspecified: Secondary | ICD-10-CM | POA: Diagnosis not present

## 2023-10-12 DIAGNOSIS — R6883 Chills (without fever): Secondary | ICD-10-CM | POA: Diagnosis not present

## 2023-10-12 DIAGNOSIS — M79629 Pain in unspecified upper arm: Secondary | ICD-10-CM | POA: Diagnosis not present

## 2023-10-12 LAB — URINALYSIS, ROUTINE W REFLEX MICROSCOPIC
Bilirubin Urine: NEGATIVE
Glucose, UA: NEGATIVE mg/dL
Ketones, ur: NEGATIVE mg/dL
Nitrite: NEGATIVE
Protein, ur: 100 mg/dL — AB
Specific Gravity, Urine: 1.018 (ref 1.005–1.030)
WBC, UA: >50 WBC/hpf (ref 0–5)
pH: 7 (ref 5.0–8.0)

## 2023-10-12 LAB — PREGNANCY, URINE: Preg Test, Ur: NEGATIVE

## 2023-10-12 MED ORDER — SODIUM CHLORIDE 0.9 % IV SOLN
1.0000 g | Freq: Once | INTRAVENOUS | Status: AC
  Administered 2023-10-12: 1 g via INTRAVENOUS
  Filled 2023-10-12: qty 10

## 2023-10-12 MED ORDER — KETOROLAC TROMETHAMINE 30 MG/ML IJ SOLN
15.0000 mg | Freq: Once | INTRAMUSCULAR | Status: AC
Start: 2023-10-12 — End: 2023-10-12
  Administered 2023-10-12: 15 mg via INTRAVENOUS
  Filled 2023-10-12: qty 1

## 2023-10-12 MED ORDER — SODIUM CHLORIDE 0.9 % IV BOLUS
1000.0000 mL | Freq: Once | INTRAVENOUS | Status: AC
Start: 2023-10-12 — End: 2023-10-12
  Administered 2023-10-12: 1000 mL via INTRAVENOUS

## 2023-10-12 MED ORDER — SODIUM CHLORIDE 0.9 % IV BOLUS
1000.0000 mL | Freq: Once | INTRAVENOUS | Status: AC
  Administered 2023-10-12: 1000 mL via INTRAVENOUS

## 2023-10-12 MED ORDER — CEPHALEXIN 500 MG PO CAPS: 500.0000 mg | ORAL_CAPSULE | Freq: Two times a day (BID) | ORAL | 0 refills | Status: AC

## 2023-10-12 NOTE — ED Triage Notes (Signed)
Pain in left abdomen and flank began yesterday around 1530-1600. Pt states she hadn't been moving in any different ways to strain a muscle. States has pain with inhalation. Took 2 tylenol yesterday but then began having chills.

## 2023-10-12 NOTE — ED Provider Notes (Signed)
[place] EMERGENCY DEPARTMENT AT [facility] Provider Note   CSN: [id number] Arrival date & time: 10/12/23  9629     History  Chief Complaint  Patient presents with   Flank Pain    Left abdominal and flank pain    Sydney Holloway is a 47 y.o. female.  HPI Patient with history of DVT, PE, generally well, not currently on blood thinning medication presents with left axillary pain.  Pain is in the left axilla only, sore, with associated subjective fever, chills, no urinary complaints, no vomiting, no shortness of breath.  Onset was yesterday, since that time pain has been persistent, is currently 6/10.    Home Medications Prior to Admission medications   Medication Sig Start Date End Date Taking? Authorizing Provider  clotrimazole-betamethasone (LOTRISONE) cream Apply 1 application topically 2 (two) times daily.    [provider]  enoxaparin (LOVENOX) 120 MG/0.8ML injection Inject 0.8 mLs (120 mg total) into the skin daily. 08/20/17   Quentin Angst, MD  Estradiol (VAGIFEM) 10 MCG TABS vaginal tablet Insert 1 tablet (10 mcg total) vaginally daily for 14 days. 12/25/22     furosemide (LASIX) 40 MG tablet Take 40 mg by mouth daily.    [provider]  Insulin Pen Needle 31G X 6 MM MISC Use as directed with Saxenda. 08/15/22   Avis Epley, PA-C  IRON PO Take 1 tablet by mouth daily. otc medication, not sure of dose    [provider]  ketoconazole (NIZORAL) 2 % cream Apply 1 application topically 2 (two) times daily.    [provider]  labetalol (NORMODYNE) 300 MG tablet Take 2 tablets (600 mg total) 3 (three) times daily by mouth. 10/01/17   Carrington Clamp, MD  Liraglutide -Weight Management (SAXENDA) 18 MG/3ML SOPN Inject 3 mg into the skin daily. 10/11/22     meclizine (ANTIVERT) 25 MG tablet Take 25 mg by mouth 4 (four) times daily.    [provider]  misoprostol (CYTOTEC) 200 MCG tablet Place 1 tablet  (200 mcg total) vaginally once for 1 dose. 12/25/22 01/02/23    mometasone (ELOCON) 0.1 % cream Apply 1 application topically 2 (two) times daily.    [provider]  naproxen (NAPROSYN) 500 MG tablet Take 500 mg by mouth 2 (two) times daily with a meal.    [provider]  NIFEdipine (PROCARDIA-XL/ADALAT CC) 60 MG 24 hr tablet Take 1 tablet (60 mg total) daily by mouth. 10/01/17   Carrington Clamp, MD  norethindrone (MICRONOR) 0.35 MG tablet Take 1 tablet by mouth every day 07/19/22     omeprazole (PRILOSEC) 20 MG capsule Take 1 capsule (20 mg total) by mouth daily. 06/27/23     ondansetron (ZOFRAN) 4 MG tablet Take 1 - 2 tablets by mouth every 6 hours if needed. 09/10/22     oxyCODONE-acetaminophen (PERCOCET/ROXICET) 5-325 MG tablet Take 1 tablet by mouth every 4 (four) hours as needed (pain scale 4-7). 09/25/17   Carrington Clamp, MD  phentermine (ADIPEX-P) 37.5 MG tablet Take 1 tablet by mouth daily. 04/13/22     phentermine (ADIPEX-P) 37.5 MG tablet Take 0.5-1 tablets (18.75-37.5 mg total) by mouth in the morning. 09/03/23     Prenatal Vit-Fe Fumarate-FA (PRENATAL MULTIVITAMIN) TABS tablet Take 1 tablet by mouth daily at 12 noon.    [provider]  rosuvastatin (CRESTOR) 10 MG tablet TAKE 1 TABLET BY MOUTH ONCE A DAY 02/01/21 02/01/22  Avis Epley, PA-C  rosuvastatin (CRESTOR)  10 MG tablet TAKE 1 TABLET BY MOUTH DAILY 01/31/21 01/31/22  Avis Epley, PA-C  rosuvastatin (CRESTOR) 10 MG tablet Take 1 tablet (10 mg total) by mouth daily. 07/02/22     rosuvastatin (CRESTOR) 10 MG tablet Take 1 tablet (10 mg total) by mouth daily. 10/03/23     SUMAtriptan (IMITREX) 100 MG tablet Take 1/2-1 tablet by mouth at onset of migraine. May repeat dose in 1-2 hours if needed. Do not exceed 200 mg in 24 hours. 06/01/22     tirzepatide (ZEPBOUND) 7.5 MG/0.5ML Pen Inject 7.5 mg into the skin once a week. 12/06/22     tobramycin-dexamethasone (TOBRADEX) ophthalmic solution Place 2 drops into  both eyes 4 (four) times daily.    [provider]  topiramate (TOPAMAX) 25 MG tablet Take 1 tablet (25 mg total) by mouth 2 (two) times daily. 11/28/22     Topiramate ER (TROKENDI XR) 100 MG CP24 Take 100 mg by mouth daily.    [provider]  torsemide (DEMADEX) 20 MG tablet Take 20 mg by mouth daily.    [provider]  torsemide (DEMADEX) 20 MG tablet TAKE 1 TABLET BY MOUTH ONCE A DAY 08/23/20 08/23/21  Shawnie Dapper, PA-C  torsemide (DEMADEX) 20 MG tablet Take 1/2 tablet by mouth daily 12/04/21     torsemide (DEMADEX) 20 MG tablet Take 1 tablet (20 mg total) by mouth daily. 08/31/22         Allergies    Guaifenesin & derivatives    Review of Systems   Review of Systems  Physical Exam Updated Vital Signs BP 118/80   Pulse (!) 121   Temp 98.5 F (36.9 C) (Oral)   Resp 20   Ht 5' (1.524 m)   Wt 60.3 kg   LMP 10/07/2023   SpO2 99%   BMI 25.97 kg/m  Physical Exam Vitals and nursing note reviewed.  Constitutional:      General: She is not in acute distress.    Appearance: She is well-developed.  HENT:     Head: Normocephalic and atraumatic.  Eyes:     Conjunctiva/sclera: Conjunctivae normal.  Cardiovascular:     Rate and Rhythm: Regular rhythm. Tachycardia present.  Pulmonary:     Effort: Pulmonary effort is normal. No respiratory distress.     Breath sounds: Normal breath sounds. No stridor.  Abdominal:     General: There is no distension.  Skin:    General: Skin is warm and dry.     Comments: No skin lesions  Neurological:     Mental Status: She is alert and oriented to person, place, and time.     Cranial Nerves: No cranial nerve deficit.  Psychiatric:        Mood and Affect: Mood normal.     ED Results / Procedures / Treatments   Labs (all labs ordered are listed, but only abnormal results are displayed) Labs Reviewed  URINALYSIS, ROUTINE W REFLEX MICROSCOPIC - Abnormal; Notable for the following components:      Result Value    APPearance CLOUDY (*)    Hgb urine dipstick SMALL (*)    Protein, ur 100 (*)    Leukocytes,Ua MODERATE (*)    Bacteria, UA FEW (*)    All other components within normal limits  PREGNANCY, URINE    EKG EKG Interpretation Date/Time:  Saturday October 12 2023 09:29:02 EST Ventricular Rate:  103 PR Interval:  142 QRS Duration:  74 QT Interval:  325 QTC Calculation:  426 R Axis:   38  Text Interpretation: Sinus tachycardia Low voltage, precordial leads Confirmed by Gerhard Munch [phone]) on 10/12/2023 9:39:55 AM  Radiology CT Renal Stone Study  Result Date: 10/12/2023 CLINICAL DATA:  Abdominal/flank pain with stone suspected. Pain is left-sided. Chills. EXAM: CT ABDOMEN AND PELVIS WITHOUT CONTRAST TECHNIQUE: Multidetector CT imaging of the abdomen and pelvis was performed following the standard protocol without IV contrast. RADIATION DOSE REDUCTION: This exam was performed according to the departmental dose-optimization program which includes automated exposure control, adjustment of the mA and/or kV according to patient size and/or use of iterative reconstruction technique. COMPARISON:  None Available. FINDINGS: Lower chest: No contributory findings. Mild scarring at the left lower lobe. Hepatobiliary: No focal liver abnormality.No evidence of biliary obstruction or stone. Pancreas: Unremarkable. Spleen: Unremarkable. Adrenals/Urinary Tract: Negative adrenals. Mild perinephric stranding around the left kidney which appears somewhat expanded at the upper pole on axial images. No stone or hydronephrosis. No gross collection. Unremarkable bladder. Stomach/Bowel:  No obstruction. No visible bowel inflammation. Vascular/Lymphatic: No acute vascular abnormality. No mass or adenopathy. Reproductive:Sizable uterus which may be affected by fibroids. 9 IUD appears located at the lower uterine segment with left-sided side-arm nearly reaching the serosa. Other: No ascites or pneumoperitoneum.  Musculoskeletal: No acute abnormalities. IMPRESSION: 1. Left perinephric stranding with borderline renal expansion, suspect pyelonephritis in this setting. No hydronephrosis or stone. 2. IUD which is somewhat low in the uterus with left-side arm possibly penetrating the myometrium. Electronically Signed   By: Tiburcio Pea M.D.   On: 10/12/2023 10:12   DG Chest 2 View  Result Date: 10/12/2023 CLINICAL DATA:  Left abdominal and flank pain. Axillary pain. Pain with inhalation. EXAM: CHEST - 2 VIEW COMPARISON:  Two-view chest x-ray 11/02/2013 FINDINGS: The heart size and mediastinal contours are within normal limits. Both lungs are clear. The visualized skeletal structures are unremarkable. IMPRESSION: Negative two view chest x-ray Electronically Signed   By: Marin Roberts M.D.   On: 10/12/2023 09:06    Procedures Procedures    Medications Ordered in ED Medications  cefTRIAXone (ROCEPHIN) 1 g in sodium chloride 0.9 % 100 mL IVPB (has no administration in time range)  sodium chloride 0.9 % bolus 1,000 mL (1,000 mLs Intravenous New Bag/Given 10/12/23 0913)  ketorolac (TORADOL) 30 MG/ML injection 15 mg (15 mg Intravenous Given 10/12/23 8119)    ED Course/ Medical Decision Making/ A&P                                 Medical Decision Making Adult female with history of DVT, PE now presents with unilateral axillary pain.  With subjective fevers and chills suspicion for infectious etiology such as pyelonephritis, though the patient has no urinary complaints.  She is not short of breath, pneumonia is a consideration, however. Patient is not currently anticoagulated, is not hypoxic, has no increased work of breathing, nor complaints of shortness of breath, PE considered. Cardiac 115 sinus tach abnormal Pulse ox 99% room air normal   Amount and/or Complexity of Data Reviewed External Data Reviewed: notes. Labs: ordered. Decision-making details documented in ED Course. Radiology: ordered  and independent interpretation performed.  Risk Prescription drug management.   Initial labs notable for hematuria, moderate leukocytes, though the patient has no urinary complaints, with consideration of pyelonephritis versus stone, CT scan ordered.  10:34 AM Patient aware of all findings and I reviewed the CT, concerning for pyelonephritis.  She  has mild tachycardia, but no hypotension, no fever, no evidence for bacteremia, sepsis.  Patient will receive IV ceftriaxone, transition to oral Keflex, is otherwise appropriate for discharge which she is comfortable with.        Final Clinical Impression(s) / ED Diagnoses Final diagnoses:  Pyelonephritis    Rx / DC Orders ED Discharge Orders     None         Gerhard Munch, MD 10/12/23 1036

## 2023-10-12 NOTE — Discharge Instructions (Addendum)
Monitor your condition carefully and do not hesitate to return here for concerning changes in your condition. ?

## 2023-10-12 NOTE — ED Notes (Signed)
Pt away at X-ray. Will administer meds upon return.

## 2023-10-18 ENCOUNTER — Other Ambulatory Visit (HOSPITAL_COMMUNITY): Payer: Self-pay

## 2023-10-18 ENCOUNTER — Other Ambulatory Visit: Payer: Self-pay

## 2023-10-18 ENCOUNTER — Encounter: Payer: Self-pay | Admitting: Pharmacist

## 2023-10-18 MED ORDER — FLUCONAZOLE 150 MG PO TABS
150.0000 mg | ORAL_TABLET | Freq: Every day | ORAL | 0 refills | Status: DC
  Filled 2023-10-18: qty 2, 2d supply, fill #0

## 2023-10-21 ENCOUNTER — Other Ambulatory Visit (HOSPITAL_COMMUNITY): Payer: Self-pay

## 2023-11-29 DIAGNOSIS — Z1231 Encounter for screening mammogram for malignant neoplasm of breast: Secondary | ICD-10-CM | POA: Diagnosis not present

## 2023-11-29 DIAGNOSIS — Z01419 Encounter for gynecological examination (general) (routine) without abnormal findings: Secondary | ICD-10-CM | POA: Diagnosis not present

## 2023-11-29 DIAGNOSIS — Z862 Personal history of diseases of the blood and blood-forming organs and certain disorders involving the immune mechanism: Secondary | ICD-10-CM | POA: Diagnosis not present

## 2023-11-29 DIAGNOSIS — Z124 Encounter for screening for malignant neoplasm of cervix: Secondary | ICD-10-CM | POA: Diagnosis not present

## 2023-12-04 ENCOUNTER — Other Ambulatory Visit: Payer: Self-pay | Admitting: Obstetrics and Gynecology

## 2023-12-04 DIAGNOSIS — R928 Other abnormal and inconclusive findings on diagnostic imaging of breast: Secondary | ICD-10-CM

## 2023-12-06 ENCOUNTER — Other Ambulatory Visit (HOSPITAL_COMMUNITY): Payer: Self-pay

## 2023-12-09 ENCOUNTER — Other Ambulatory Visit: Payer: Self-pay

## 2023-12-09 ENCOUNTER — Other Ambulatory Visit (HOSPITAL_COMMUNITY): Payer: Self-pay

## 2023-12-09 MED ORDER — TOPIRAMATE 25 MG PO TABS
25.0000 mg | ORAL_TABLET | Freq: Two times a day (BID) | ORAL | 4 refills | Status: AC
  Filled 2023-12-09: qty 60, 30d supply, fill #0
  Filled 2024-02-12: qty 60, 30d supply, fill #1
  Filled 2024-03-12: qty 60, 30d supply, fill #2
  Filled 2024-06-22: qty 60, 30d supply, fill #3

## 2023-12-09 MED ORDER — ROSUVASTATIN CALCIUM 10 MG PO TABS
10.0000 mg | ORAL_TABLET | Freq: Every day | ORAL | 2 refills | Status: AC
  Filled 2023-12-09 – 2024-02-12 (×2): qty 90, 90d supply, fill #0
  Filled 2024-06-22: qty 90, 90d supply, fill #1
  Filled 2024-09-17 (×2): qty 90, 90d supply, fill #2

## 2023-12-21 ENCOUNTER — Ambulatory Visit
Admission: RE | Admit: 2023-12-21 | Discharge: 2023-12-21 | Disposition: A | Discharge status: Home / Self Care | Payer: Commercial Managed Care - PPO | Source: Ambulatory Visit | Attending: Obstetrics and Gynecology | Admitting: Obstetrics and Gynecology

## 2023-12-21 ENCOUNTER — Ambulatory Visit: Payer: Commercial Managed Care - PPO

## 2023-12-21 DIAGNOSIS — R928 Other abnormal and inconclusive findings on diagnostic imaging of breast: Secondary | ICD-10-CM

## 2023-12-27 ENCOUNTER — Other Ambulatory Visit: Payer: Commercial Managed Care - PPO

## 2024-02-12 ENCOUNTER — Other Ambulatory Visit: Payer: Self-pay

## 2024-02-12 ENCOUNTER — Other Ambulatory Visit (HOSPITAL_COMMUNITY): Payer: Self-pay

## 2024-03-12 ENCOUNTER — Other Ambulatory Visit (HOSPITAL_COMMUNITY): Payer: Self-pay

## 2024-05-25 DIAGNOSIS — N921 Excessive and frequent menstruation with irregular cycle: Secondary | ICD-10-CM | POA: Diagnosis not present

## 2024-05-25 DIAGNOSIS — R5383 Other fatigue: Secondary | ICD-10-CM | POA: Diagnosis not present

## 2024-05-25 DIAGNOSIS — E782 Mixed hyperlipidemia: Secondary | ICD-10-CM | POA: Diagnosis not present

## 2024-05-25 DIAGNOSIS — R002 Palpitations: Secondary | ICD-10-CM | POA: Diagnosis not present

## 2024-06-17 DIAGNOSIS — N921 Excessive and frequent menstruation with irregular cycle: Secondary | ICD-10-CM | POA: Diagnosis not present

## 2024-06-22 ENCOUNTER — Other Ambulatory Visit (HOSPITAL_COMMUNITY): Payer: Self-pay

## 2024-07-14 ENCOUNTER — Other Ambulatory Visit (HOSPITAL_COMMUNITY): Payer: Self-pay

## 2024-07-14 ENCOUNTER — Other Ambulatory Visit: Payer: Self-pay

## 2024-07-14 MED ORDER — TOPIRAMATE 50 MG PO TABS
50.0000 mg | ORAL_TABLET | Freq: Two times a day (BID) | ORAL | 1 refills | Status: AC
  Filled 2024-07-14: qty 60, 30d supply, fill #0

## 2024-07-15 ENCOUNTER — Other Ambulatory Visit (HOSPITAL_COMMUNITY): Payer: Self-pay

## 2024-07-15 MED ORDER — METOPROLOL SUCCINATE ER 25 MG PO TB24
25.0000 mg | ORAL_TABLET | Freq: Every day | ORAL | 1 refills | Status: AC
  Filled 2024-07-15: qty 90, 90d supply, fill #0
  Filled 2024-09-17 – 2024-11-16 (×2): qty 90, 90d supply, fill #1

## 2024-07-15 MED ORDER — TOPIRAMATE 50 MG PO TABS
50.0000 mg | ORAL_TABLET | Freq: Two times a day (BID) | ORAL | 3 refills | Status: AC
  Filled 2024-07-15 – 2024-09-17 (×3): qty 180, 90d supply, fill #0

## 2024-07-16 ENCOUNTER — Other Ambulatory Visit (HOSPITAL_COMMUNITY): Payer: Self-pay

## 2024-07-16 ENCOUNTER — Other Ambulatory Visit: Payer: Self-pay

## 2024-09-17 ENCOUNTER — Other Ambulatory Visit (HOSPITAL_COMMUNITY): Payer: Self-pay

## 2024-09-17 ENCOUNTER — Other Ambulatory Visit: Payer: Self-pay

## 2024-09-20 MED ORDER — OMEPRAZOLE 20 MG PO CPDR
20.0000 mg | DELAYED_RELEASE_CAPSULE | Freq: Every day | ORAL | 3 refills | Status: AC
  Filled 2024-09-20: qty 90, 90d supply, fill #0

## 2024-09-21 ENCOUNTER — Other Ambulatory Visit: Payer: Self-pay

## 2024-09-21 ENCOUNTER — Other Ambulatory Visit (HOSPITAL_COMMUNITY): Payer: Self-pay

## 2024-11-16 ENCOUNTER — Other Ambulatory Visit (HOSPITAL_COMMUNITY): Payer: Self-pay
